# Patient Record
Sex: Male | Born: 1950 | Race: White | Hispanic: No | Marital: Married
Health system: Midwestern US, Community
[De-identification: ages and names within clinical notes are randomized; demographics above are authoritative.]

## PROBLEM LIST (undated history)

## (undated) DIAGNOSIS — N201 Calculus of ureter: Secondary | ICD-10-CM

## (undated) DIAGNOSIS — N132 Hydronephrosis with renal and ureteral calculous obstruction: Secondary | ICD-10-CM

## (undated) DIAGNOSIS — H269 Unspecified cataract: Secondary | ICD-10-CM

## (undated) DIAGNOSIS — I639 Cerebral infarction, unspecified: Secondary | ICD-10-CM

## (undated) DIAGNOSIS — R7309 Other abnormal glucose: Secondary | ICD-10-CM

## (undated) DIAGNOSIS — M109 Gout, unspecified: Secondary | ICD-10-CM

## (undated) DIAGNOSIS — E785 Hyperlipidemia, unspecified: Secondary | ICD-10-CM

## (undated) DIAGNOSIS — I7771 Dissection of carotid artery: Secondary | ICD-10-CM

## (undated) DIAGNOSIS — R350 Frequency of micturition: Secondary | ICD-10-CM

## (undated) DIAGNOSIS — N401 Enlarged prostate with lower urinary tract symptoms: Secondary | ICD-10-CM

## (undated) HISTORY — PX: CATARACT EXTRACTION: SUR2

## (undated) HISTORY — PX: VASECTOMY: SHX75

## (undated) HISTORY — DX: Hyperlipidemia, unspecified: E78.5

## (undated) HISTORY — DX: Cerebral infarction, unspecified: I63.9

## (undated) HISTORY — DX: Other abnormal glucose: R73.09

## (undated) HISTORY — DX: Gout, unspecified: M10.9

## (undated) HISTORY — PX: TONSILLECTOMY AND ADENOIDECTOMY: SUR1326

## (undated) HISTORY — DX: Dissection of carotid artery: I77.71

## (undated) HISTORY — DX: Unspecified cataract: H26.9

---

## 2004-10-07 HISTORY — PX: COLONOSCOPY: SHX174

## 2004-12-28 ENCOUNTER — Ambulatory Visit: Payer: Self-pay | Admitting: Internal Medicine

## 2005-01-15 ENCOUNTER — Ambulatory Visit: Payer: Self-pay | Admitting: Internal Medicine

## 2005-04-12 ENCOUNTER — Ambulatory Visit: Payer: Self-pay | Admitting: Internal Medicine

## 2006-04-14 ENCOUNTER — Ambulatory Visit: Payer: Self-pay | Admitting: Internal Medicine

## 2006-11-07 ENCOUNTER — Ambulatory Visit: Payer: Self-pay | Admitting: Internal Medicine

## 2006-12-31 ENCOUNTER — Ambulatory Visit: Payer: Self-pay | Admitting: Internal Medicine

## 2007-04-20 ENCOUNTER — Ambulatory Visit: Payer: Self-pay | Admitting: Internal Medicine

## 2007-04-24 ENCOUNTER — Encounter (INDEPENDENT_AMBULATORY_CARE_PROVIDER_SITE_OTHER): Payer: Self-pay | Admitting: *Deleted

## 2007-04-24 LAB — CONVERTED CEMR LAB
CO2: 27 meq/L (ref 19–32)
Calcium: 9.6 mg/dL (ref 8.4–10.5)
Chloride: 106 meq/L (ref 96–112)
Cholesterol: 176 mg/dL (ref 0–200)
GFR calc Af Amer: 100 mL/min
GFR calc non Af Amer: 82 mL/min
LDL Cholesterol: 123 mg/dL — ABNORMAL HIGH (ref 0–99)
Potassium: 4.6 meq/L (ref 3.5–5.1)
Total CHOL/HDL Ratio: 5.5
VLDL: 21 mg/dL (ref 0–40)

## 2007-05-28 ENCOUNTER — Ambulatory Visit: Payer: Self-pay | Admitting: Internal Medicine

## 2007-05-28 DIAGNOSIS — E785 Hyperlipidemia, unspecified: Secondary | ICD-10-CM | POA: Insufficient documentation

## 2007-05-28 DIAGNOSIS — Z8739 Personal history of other diseases of the musculoskeletal system and connective tissue: Secondary | ICD-10-CM | POA: Insufficient documentation

## 2008-03-25 ENCOUNTER — Ambulatory Visit: Payer: Self-pay | Admitting: Internal Medicine

## 2008-05-24 ENCOUNTER — Telehealth (INDEPENDENT_AMBULATORY_CARE_PROVIDER_SITE_OTHER): Payer: Self-pay | Admitting: *Deleted

## 2008-10-20 ENCOUNTER — Ambulatory Visit: Payer: Self-pay | Admitting: Internal Medicine

## 2008-10-25 ENCOUNTER — Encounter (INDEPENDENT_AMBULATORY_CARE_PROVIDER_SITE_OTHER): Payer: Self-pay | Admitting: *Deleted

## 2008-10-25 ENCOUNTER — Telehealth (INDEPENDENT_AMBULATORY_CARE_PROVIDER_SITE_OTHER): Payer: Self-pay | Admitting: *Deleted

## 2009-01-16 ENCOUNTER — Ambulatory Visit: Payer: Self-pay | Admitting: Internal Medicine

## 2009-01-23 LAB — CONVERTED CEMR LAB
Basophils Absolute: 0.1 10*3/uL (ref 0.0–0.1)
Bilirubin, Direct: 0 mg/dL (ref 0.0–0.3)
Cholesterol: 150 mg/dL (ref 0–200)
Eosinophils Relative: 2.5 % (ref 0.0–5.0)
HDL: 27.3 mg/dL — ABNORMAL LOW (ref >39.00)
Hgb A1c MFr Bld: 6.4 % (ref 4.6–6.5)
Monocytes Absolute: 0.4 10*3/uL (ref 0.1–1.0)
Monocytes Relative: 7.7 % (ref 3.0–12.0)
Neutro Abs: 2.6 10*3/uL (ref 1.4–7.7)
Neutrophils Relative %: 50.9 % (ref 43.0–77.0)
Platelets: 116 10*3/uL — ABNORMAL LOW (ref 150.0–400.0)
Total Bilirubin: 1.2 mg/dL (ref 0.3–1.2)
Total CHOL/HDL Ratio: 5
Triglycerides: 102 mg/dL (ref 0.0–149.0)
VLDL: 20.4 mg/dL (ref 0.0–40.0)

## 2009-01-24 ENCOUNTER — Ambulatory Visit: Payer: Self-pay | Admitting: Internal Medicine

## 2009-01-24 DIAGNOSIS — D696 Thrombocytopenia, unspecified: Secondary | ICD-10-CM | POA: Insufficient documentation

## 2009-01-24 DIAGNOSIS — R7301 Impaired fasting glucose: Secondary | ICD-10-CM

## 2009-01-24 LAB — CONVERTED CEMR LAB: LDL Goal: 100 mg/dL

## 2009-05-01 ENCOUNTER — Ambulatory Visit: Payer: Self-pay | Admitting: Internal Medicine

## 2009-05-09 ENCOUNTER — Encounter (INDEPENDENT_AMBULATORY_CARE_PROVIDER_SITE_OTHER): Payer: Self-pay | Admitting: *Deleted

## 2009-05-16 ENCOUNTER — Ambulatory Visit: Payer: Self-pay | Admitting: Internal Medicine

## 2009-10-07 DIAGNOSIS — I7771 Dissection of carotid artery: Secondary | ICD-10-CM

## 2009-10-07 HISTORY — DX: Dissection of carotid artery: I77.71

## 2009-10-28 ENCOUNTER — Encounter: Payer: Self-pay | Admitting: Internal Medicine

## 2010-01-23 ENCOUNTER — Encounter: Payer: Self-pay | Admitting: Internal Medicine

## 2010-01-23 ENCOUNTER — Encounter (INDEPENDENT_AMBULATORY_CARE_PROVIDER_SITE_OTHER): Payer: Self-pay | Admitting: Internal Medicine

## 2010-01-23 ENCOUNTER — Ambulatory Visit: Payer: Self-pay | Admitting: Vascular Surgery

## 2010-01-23 ENCOUNTER — Telehealth: Payer: Self-pay | Admitting: Internal Medicine

## 2010-01-23 DIAGNOSIS — I639 Cerebral infarction, unspecified: Secondary | ICD-10-CM

## 2010-01-23 HISTORY — DX: Cerebral infarction, unspecified: I63.9

## 2010-01-24 ENCOUNTER — Telehealth: Payer: Self-pay | Admitting: Internal Medicine

## 2010-01-29 ENCOUNTER — Ambulatory Visit: Payer: Self-pay | Admitting: Internal Medicine

## 2010-01-29 ENCOUNTER — Ambulatory Visit: Payer: Self-pay | Admitting: Cardiovascular Disease

## 2010-01-29 DIAGNOSIS — I7771 Dissection of carotid artery: Secondary | ICD-10-CM

## 2010-01-29 LAB — CONVERTED CEMR LAB: POC INR: 2.3

## 2010-02-02 ENCOUNTER — Ambulatory Visit: Payer: Self-pay | Admitting: Cardiology

## 2010-02-05 ENCOUNTER — Telehealth (INDEPENDENT_AMBULATORY_CARE_PROVIDER_SITE_OTHER): Payer: Self-pay | Admitting: *Deleted

## 2010-02-12 ENCOUNTER — Ambulatory Visit: Payer: Self-pay | Admitting: Cardiology

## 2010-02-12 LAB — CONVERTED CEMR LAB: POC INR: 5

## 2010-02-19 ENCOUNTER — Ambulatory Visit: Payer: Self-pay | Admitting: Cardiovascular Disease

## 2010-03-02 ENCOUNTER — Ambulatory Visit: Payer: Self-pay | Admitting: Cardiovascular Disease

## 2010-03-26 ENCOUNTER — Ambulatory Visit: Payer: Self-pay | Admitting: Cardiology

## 2010-04-16 ENCOUNTER — Ambulatory Visit: Payer: Self-pay | Admitting: Internal Medicine

## 2010-04-16 LAB — CONVERTED CEMR LAB
ALT: 33 units/L (ref 0–53)
Alkaline Phosphatase: 65 units/L (ref 39–117)
Basophils Absolute: 0 10*3/uL (ref 0.0–0.1)
Basophils Relative: 0.7 % (ref 0.0–3.0)
Eosinophils Absolute: 0.1 10*3/uL (ref 0.0–0.7)
HCT: 41.6 % (ref 39.0–52.0)
Hemoglobin: 14.4 g/dL (ref 13.0–17.0)
Lymphs Abs: 1.5 10*3/uL (ref 0.7–4.0)
MCHC: 34.6 g/dL (ref 30.0–36.0)
MCV: 86.2 fL (ref 78.0–100.0)
Monocytes Absolute: 0.3 10*3/uL (ref 0.1–1.0)
Platelets: 88 10*3/uL — ABNORMAL LOW (ref 150.0–400.0)
RBC: 4.83 M/uL (ref 4.22–5.81)
RDW: 14.1 % (ref 11.5–14.6)
Total CHOL/HDL Ratio: 4
Total CK: 96 units/L (ref 7–232)
VLDL: 24.2 mg/dL (ref 0.0–40.0)

## 2010-04-23 ENCOUNTER — Ambulatory Visit: Payer: Self-pay | Admitting: Internal Medicine

## 2010-04-24 ENCOUNTER — Ambulatory Visit: Payer: Self-pay | Admitting: Internal Medicine

## 2010-04-24 LAB — CONVERTED CEMR LAB: POC INR: 2.3

## 2010-04-30 ENCOUNTER — Encounter: Payer: Self-pay | Admitting: Internal Medicine

## 2010-05-15 ENCOUNTER — Encounter: Admission: RE | Admit: 2010-05-15 | Discharge: 2010-05-15 | Payer: Self-pay | Admitting: Neurology

## 2010-06-28 ENCOUNTER — Encounter: Payer: Self-pay | Admitting: Cardiology

## 2010-09-13 ENCOUNTER — Inpatient Hospital Stay (HOSPITAL_COMMUNITY): Admission: EM | Admit: 2010-09-13 | Discharge: 2010-01-25 | Payer: Self-pay | Admitting: Emergency Medicine

## 2010-10-05 IMAGING — CT CT ANGIO HEAD
1 of 12 series · 1 of 33 positions shown · IV contrast ([ID] OMNI 350)
Comparison: 01/23/2010 and earlier.

CTA NECK

CLINICAL DATA: 58-year-old male with history of left ICA
occlusion/dissection.

CT ANGIOGRAPHY HEAD AND NECK
TECHNIQUE: Multidetector CT imaging of the head and neck was
performed using the standard protocol during bolus administration
of intravenous contrast.  Multiplanar CT image reconstructions
including MIPs were obtained to evaluate the vascular anatomy.
Carotid stenosis measurements (when applicable) are obtained
utilizing NASCET criteria, using the distal internal carotid
diameter as the denominator.
Contrast:  100 ml Omnipaque 350.

[Series 3: neck w/o · axial · non-contrast · 0.39mm/px · 1 of 89 slices shown]
[im 1/89  soft-tissue]
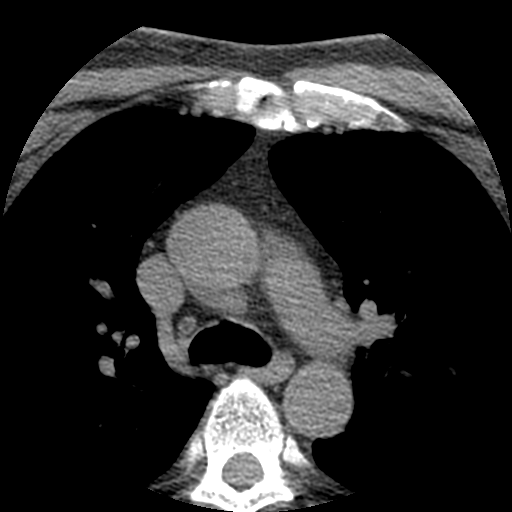

[1 of 33 positions shown; findings below may reference images not displayed]

FINDINGS: Visualized lung apices are clear. No acute osseous
abnormality identified.  Aside from mild ethmoid mucosal
thickening, the visualized paranasal sinuses and mastoids are
clear.  No lymphadenopathy.  Pharyngeal mucosal spaces and the
thyroid are within normal limits.  Parapharyngeal, retropharyngeal
and sublingual spaces are within normal limits.  Submandibular and
parotid glands are within normal limits. Visualized orbit soft
tissues are within normal limits.

Vascular Findings: Three vessel arch configuration; however the
left vertebral artery origin is low, nearly off the arch (series
601 image 27).   No arch atherosclerosis or great vessel stenosis.
Bilateral vertebral arteries are within normal limits to the skull
base, the right is mildly dominant.

Normal right common carotid artery, right carotid bifurcation and
cervical right ICA aside from a somewhat retropharyngeal course of
the latter.

Left common carotid artery is mildly diminutive compared to the
right.  The left carotid bifurcation is patent, there is abrupt
occlusion of the left ICA 18 mm beyond its origin (series 502 image
118). This is very similar in appearance to the prior exam, the
distal aspect of the patent lumen is slightly less irregular than
before.  There is no subsequent cervical left ICA enhancement.  See
intracranial findings below.

 Review of the MIP images confirms the above findings.
IMPRESSION: 1.  Stable occlusion of the left ICA 18 mm beyond its origin. See
intracranial findings below.
2.  Otherwise stable and negative neck CTA.  No acute findings in
the neck.

CTA HEAD
FINDINGS: Cerebral volume is within normal limits for age.  No
midline shift, ventriculomegaly, mass effect, evidence of mass
lesion, intracranial hemorrhage or evidence of cortically based
acute infarction.  Gray-white matter differentiation is within
normal limits throughout the brain.  Visualized scalp soft tissues
are within normal limits.  No acute osseous abnormality identified.

Vascular Findings:  Major intracranial venous structures are
normally enhancing.

 Distal vertebral arteries and vertebrobasilar junction are normal
aside from tortuosity.  Normal bilateral PICA vessels.  Basilar
artery, superior cerebellar arteries and PCA origins are normal.
Bilateral PCA branches are within normal limits.  Diminutive right
posterior communicating artery, the left is not identified.

The right ICA siphon is within normal limits aside from minimal
calcified atherosclerosis.  The right ICA terminus, right MCA, and
right ACA origins are normal.  Right MCA branches are within normal
limits.

The left anterior communicating artery is diminutive.  Bilateral
ACA branches are within normal limits.

There is minimal reconstitution of flow in the left ICA siphon,
beginning only faintly in the supraclinoid segment.  However, there
is better reconstitution of flow at the level of the left ICA
terminus.  The left MCA and ACA origins subsequently only are
mildly asymmetric compared to the right.  Left MCA branches are
within normal limits.  Left MCA enhancement compared to the right
is symmetric.

 Review of the MIP images confirms the above findings.
IMPRESSION: 1.  Little reconstitution of flow in the left ICA siphon, but
relatively good reconstitution of flow by the left ICA terminus.
Left MCA and ACA enhancement appears symmetric to the right.
2.  Otherwise negative intracranial CTA.
3. No acute intracranial abnormality.

## 2010-10-07 HISTORY — PX: EYE SURGERY: SHX253

## 2010-11-04 LAB — CONVERTED CEMR LAB
Albumin: 4.6 g/dL (ref 3.5–5.2)
Alkaline Phosphatase: 78 units/L (ref 39–117)
Basophils Absolute: 0 10*3/uL (ref 0.0–0.1)
Blood in Urine, dipstick: NEGATIVE
CO2: 27 meq/L (ref 19–32)
Calcium: 9.7 mg/dL (ref 8.4–10.5)
Cholesterol: 214 mg/dL (ref 0–200)
Eosinophils Absolute: 0.2 10*3/uL (ref 0.0–0.7)
Glucose, Bld: 107 mg/dL — ABNORMAL HIGH (ref 70–99)
HCT: 46.1 % (ref 39.0–52.0)
HDL goal, serum: 40 mg/dL
HDL: 29.7 mg/dL — ABNORMAL LOW (ref >39.0)
Hemoglobin: 15.8 g/dL (ref 13.0–17.0)
Hgb A1c MFr Bld: 6 % (ref 4.6–6.0)
Ketones, urine, test strip: NEGATIVE
Lymphocytes Relative: 37.4 % (ref 12.0–46.0)
MCV: 84.9 fL (ref 78.0–100.0)
Monocytes Absolute: 0.3 10*3/uL (ref 0.1–1.0)
Monocytes Relative: 6.5 % (ref 3.0–12.0)
Platelets: 108 10*3/uL — ABNORMAL LOW (ref 150–400)
Potassium: 3.6 meq/L (ref 3.5–5.1)
RDW: 12.8 % (ref 11.5–14.6)
TSH: 1.18 microintl units/mL (ref 0.35–5.50)
Total Bilirubin: 1.1 mg/dL (ref 0.3–1.2)
Triglycerides: 185 mg/dL — ABNORMAL HIGH (ref 0–149)
VLDL: 37 mg/dL (ref 0–40)
WBC Urine, dipstick: NEGATIVE

## 2010-11-06 NOTE — Medication Information (Signed)
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 no    Recent/future dental: no  Any missed doses?: no       Is patient compliant with meds? yes       Allergies: No Known Drug Allergies  Anticoagulation Management History:      The patient is taking warfarin and comes in today for a routine follow up visit.  Negative risk factors for bleeding include an age less than 68 years old and no history of CVA/TIA.  The bleeding index is 'low risk'.  Negative CHADS2 values include History of HTN, Age > 78 years old, History of Diabetes, and Prior Stroke/CVA/TIA.  Anticoagulation responsible provider: Juanda Chance MD, Smitty Cords.  INR POC: 2.6.  Cuvette Lot#: [id number].  Exp: 03/2011.    Anticoagulation Management Assessment/Plan:      The patient's current anticoagulation dose is Coumadin 7.5 mg tabs: 1 tab once daily.  The target INR is 2.0-3.0.  The next INR is due 02/12/2010.  Anticoagulation instructions were given to patient.  Results were reviewed/authorized by Weston Brass, PharmD.  He was notified by Weston Brass PharmD.         Prior Anticoagulation Instructions: INR 2.3  Start taking 7.5mg  daily except 3.75mg  on Wednesdays.  Recheck on Friday.    Current Anticoagulation Instructions: INR 2.6  Continue same dose of 1 tablet every day except 1.2 tablet on Wednesday

## 2010-11-06 NOTE — Progress Notes (Signed)
Summary: Request to speak with Dr  Phone Note Call from Bryan Abbott Call back at [phone]   Caller: Bryan Abbott Summary of Call: Spoke with Bryan Abbott,  Bryan Abbott:  I  would like to make sure Dr.Severus Brodzinski is aware that I'm in the Hospital. Bryan Abbott said he would like to speak with Dr.Artrell Lawless and let him know whats going on.   Me:  Informed Bryan Abbott that we have access to [facility] records through the computer.   Bryan Abbott:  He knows Dr.Kandance Yano is busy and would appreciate a call when he is avaliable.  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

## 2010-11-06 NOTE — Medication Information (Signed)
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 no    Recent/future dental: no  Any missed doses?: yes     Details: Missed 1 tablet 1 week ago  Is patient compliant with meds? yes       Current Medications (verified): 1)  Allopurinol 300 Mg  Tabs (Allopurinol) .... Take One Tablet Daily 2)  Pravastatin Sodium 40 Mg Tabs (Pravastatin Sodium) .Marland Kitchen.. 1 Qhs 3)  Tricor 145 Mg Tabs (Fenofibrate) .Marland Kitchen.. 1 Tab Once Daily 4)  Coumadin 7.5 Mg Tabs (Warfarin Sodium) .Marland Kitchen.. 1 Tab Once Daily 5)  Tylenol .... As Needed 6)  Depakote Er 500 Mg Xr24h-Tab (Divalproex Sodium) .... Take 1 Tablet Daily.  Allergies (verified): No Known Drug Allergies  Anticoagulation Management History:      The patient is taking warfarin and comes in today for a routine follow up visit.  Negative risk factors for bleeding include an age less than 41 years old and no history of CVA/TIA.  The bleeding index is 'low risk'.  Negative CHADS2 values include History of HTN, Age > 10 years old, History of Diabetes, and Prior Stroke/CVA/TIA.  His last INR was 3.7.  Anticoagulation responsible provider: Shirlee Latch MD, Zenon Leaf.  INR POC: 2.6.  Cuvette Lot#: [id number].  Exp: 06/2011.    Anticoagulation Management Assessment/Plan:      The patient's current anticoagulation dose is Coumadin 7.5 mg tabs: 1 tab once daily.  The target INR is 2.0-3.0.  The next INR is due 04/23/2010.  Anticoagulation instructions were given to patient.  Results were reviewed/authorized by Jeralene Peters, PharmD.         Prior Anticoagulation Instructions: INR  3.0  Start NEW dosing schedule of 1/2 tablet on Wednesday and Friday and 1 tablet all other days.  Return to clinic in 3 weeks.    Current Anticoagulation Instructions: INR 2.6  Continue taking 1 tablet everyday except take 1/2 tablet on Wednesdays and Fridays. Recheck in 4 weeks.

## 2010-11-06 NOTE — Letter (Signed)
Summary: [facility]  [facility]   Imported By: Lennie Odor 05/07/2010 10:40:00  _____________________________________________________________________  External Attachment:    Type:   Image     Comment:   External Document

## 2010-11-06 NOTE — Progress Notes (Signed)
Summary: refill   Phone Note Refill Request Message from:  Patient on Feb 05, 2010 8:44 AM  Refills Requested: Medication #1:  ALLOPURINOL 300 MG  TABS TAKE ONE TABLET DAILY  Medication #2:  PRAVASTATIN SODIUM 40 MG TABS 1 qhs patient has cpx scheduled [id number] - lab [phone] he needs refill until then - [facility] [place]   Initial call taken by: Okey Regal [place],  Feb 05, 2010 8:46 AM    Prescriptions: PRAVASTATIN SODIUM 40 MG TABS (PRAVASTATIN SODIUM) 1 qhs  #90 x 0   Entered by:   Shonna Chock   Authorized by:   Marga Melnick MD   Signed by:   Shonna Chock on 02/05/2010   Method used:   Electronically to        [facility]       [address]       [place], [place]  [address]       Ph: [phone]       Fax: [phone]   RxID:   [id number] ALLOPURINOL 300 MG  TABS (ALLOPURINOL) TAKE ONE TABLET DAILY  #90 x 0   Entered by:   Shonna Chock   Authorized by:   Marga Melnick MD   Signed by:   Shonna Chock on 02/05/2010   Method used:   Electronically to        [facility]       [address]       [place], [place]  [address]       Ph: [phone]       Fax: [phone]   RxID:   [id number]

## 2010-11-06 NOTE — Medication Information (Signed)
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 yes       Details: finished augmentin course and started new depakote rx  Recent/future dental: no  Any missed doses?: no       Is patient compliant with meds? yes       Current Medications (verified): 1)  Allopurinol 300 Mg  Tabs (Allopurinol) .... Take One Tablet Daily 2)  Pravastatin Sodium 40 Mg Tabs (Pravastatin Sodium) .Marland Kitchen.. 1 Qhs 3)  Tricor 145 Mg Tabs (Fenofibrate) .Marland Kitchen.. 1 Tab Once Daily 4)  Coumadin 7.5 Mg Tabs (Warfarin Sodium) .Marland Kitchen.. 1 Tab Once Daily 5)  Tylenol .... As Needed 6)  Depakote Er 500 Mg Xr24h-Tab (Divalproex Sodium) .... Take 1 Tablet Daily.  Allergies (verified): No Known Drug Allergies  Anticoagulation Management History:      The patient is taking warfarin and comes in today for a routine follow up visit.  Negative risk factors for bleeding include an age less than 83 years old and no history of CVA/TIA.  The bleeding index is 'low risk'.  Negative CHADS2 values include History of HTN, Age > 2 years old, History of Diabetes, and Prior Stroke/CVA/TIA.  His last INR was 3.7.  Anticoagulation responsible provider: Eden Emms MD, Theron Arista.  INR POC: 3.0.  Cuvette Lot#: [id number].  Exp: 05/2011.    Anticoagulation Management Assessment/Plan:      The patient's current anticoagulation dose is Coumadin 7.5 mg tabs: 1 tab once daily.  The target INR is 2.0-3.0.  The next INR is due 03/23/2010.  Anticoagulation instructions were given to patient.  Results were reviewed/authorized by Eda Keys, PharmD.  He was notified by  Eda Keys.         Prior Anticoagulation Instructions: INR = 3.6  Hold tonights dose  The patient is to continue with the same dose of coumadin.  This dosage includes: Then resume normal schedule on tuesday (1 tablet every day except wednesdays take 1/2 tab.   Current Anticoagulation Instructions: INR 3.0  Start NEW dosing schedule of 1/2 tablet on Wednesday and Friday and 1 tablet all other days.  Return to clinic in 3 weeks.

## 2010-11-06 NOTE — Assessment & Plan Note (Signed)
Summary: hospital follow-up//lch   Vital Signs:  Patient profile:   60 year old male Weight:      207 pounds Temp:     98.7 degrees F oral Pulse rate:   72 / minute Resp:     14 per minute BP sitting:   100 / 70  (left arm)  Vitals Entered By: Jeremy Johann CMA (January 29, 2010 12:48 PM) CC: hosp f/u Comments REVIEWED MED LIST, PATIENT AGREED DOSE AND INSTRUCTION CORRECT    CC:  hosp f/u.  History of Present Illness: Presentation was visual field loss & blurred vision.ER 01/22/2010  records reviewed :"dissection of L carotid artery" on MRA; platelet # 94,000. BP ranged 124/76-147/89 in ER;156/? @ home prior to ER visit.Other labs WNL .Only neck trauma was riding a  mechanical bull 1 month ago.No PMH of HTN.   Allergies (verified): No Known Drug Allergies  Review of Systems Eyes:  Denies blurring, double vision, and vision loss-1 eye; Vision back to normal. Neuro:  Denies brief paralysis, disturbances in coordination, numbness, poor balance, tingling, tremors, and weakness.  Physical Exam  General:  well-nourished,in no acute distress; alert,appropriate and cooperative throughout examination Eyes:  No corneal or conjunctival inflammation noted. EOMI. Perrla. Field of  Vision grossly normal. Heart:  normal rate, regular rhythm, no gallop, no rub, no JVD, and grade 1/2 /6 systolic murmur R base.   Pulses:  R and L carotid,radial,dorsalis pedis and posterior tibial pulses are full and equal bilaterally. No carotid bruit Neurologic:  alert & oriented X3, strength normal in all extremities, and DTRs symmetrical and normal.     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 1 qhs    Tricor 145 Mg Tabs (Fenofibrate) .Marland Kitchen... 1 tab once daily  Problem # 3:  THROMBOCYTOPENIA  (ICD-287.5)  Problem # 4:  FASTING HYPERGLYCEMIA (ICD-790.29)  Complete Medication List: 1)  Allopurinol 300 Mg Tabs (Allopurinol) .... Take one tablet daily 2)  Pravastatin Sodium 40 Mg Tabs (Pravastatin sodium) .Marland Kitchen.. 1 qhs 3)  Tricor 145 Mg Tabs (Fenofibrate) .Marland Kitchen.. 1 tab once daily 4)  Coumadin 7.5 Mg Tabs (Warfarin sodium) .Marland Kitchen.. 1 tab once daily 5)  Tylenol  .... As needed  Patient Instructions: 1)  Fasting labs in 10 weeks:CPK 995.20, 2)  Hepatic Panel  995.20, 3)  Lipid Panel 272.4, 4)  HbgA1C  790.29. 5)  CBC w/ Diff 287.5, 995.20. 6)  PSA .

## 2010-11-06 NOTE — Miscellaneous (Signed)
Summary: [facility]  [facility]   Imported By: Lanelle Bal 11/03/2009 10:06:32  _____________________________________________________________________  External Attachment:    Type:   Image     Comment:   External Document

## 2010-11-06 NOTE — Medication Information (Signed)
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  PCP aware  Bleeding/hemorrhagic complications: no    Recent/future hospitalizations: no    Any changes in medication regimen? no    Recent/future dental: no  Any missed doses?: no       Is patient compliant with meds? yes       Allergies: No Known Drug Allergies  Anticoagulation Management History:      The patient is taking warfarin and comes in today for a routine follow up visit.  Negative risk factors for bleeding include an age less than 53 years old and no history of CVA/TIA.  The bleeding index is 'low risk'.  Negative CHADS2 values include History of HTN, Age > 58 years old, History of Diabetes, and Prior Stroke/CVA/TIA.  His last INR was 3.7.  Anticoagulation responsible provider: Graciela Husbands MD, Viviann Spare.  INR POC: 2.3.  Cuvette Lot#: [id number].  Exp: 07/2011.    Anticoagulation Management Assessment/Plan:      The patient's current anticoagulation dose is Coumadin 7.5 mg tabs: 1 tab once daily.  The target INR is 2.0-3.0.  The next INR is due 05/21/2010.  Anticoagulation instructions were given to patient.  Results were reviewed/authorized by Weston Brass, PharmD.  He was notified by Weston Brass PharmD.         Prior Anticoagulation Instructions: INR 2.6  Continue taking 1 tablet everyday except take 1/2 tablet on Wednesdays and Fridays. Recheck in 4 weeks.  Current Anticoagulation Instructions: INR 2.3  Continue same dose of 1 tablet every day except 1/2 tablet on Wednesday and Friday.  Recheck INR in 4 weeks.

## 2010-11-06 NOTE — Medication Information (Signed)
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 yes       Details: Is taking an augmentin for Ear infection  Recent/future dental: no  Any missed doses?: no       Is patient compliant with meds? yes      Comments: Pt. not sure why INR was high last visit or this visit.  He is currently taking augmentin which should not affect his INR too much.   He will skip tonights dose, resume normal schedule tuesday, and increase his intake of greens and come back in two weeks.    Current Medications (verified): 1)  Allopurinol 300 Mg  Tabs (Allopurinol) .... Take One Tablet Daily 2)  Pravastatin Sodium 40 Mg Tabs (Pravastatin Sodium) .Marland Kitchen.. 1 Qhs 3)  Tricor 145 Mg Tabs (Fenofibrate) .Marland Kitchen.. 1 Tab Once Daily 4)  Coumadin 7.5 Mg Tabs (Warfarin Sodium) .Marland Kitchen.. 1 Tab Once Daily 5)  Tylenol .... As Needed  Allergies (verified): No Known Drug Allergies  Anticoagulation Management History:      Negative risk factors for bleeding include an age less than 49 years old and no history of CVA/TIA.  The bleeding index is 'low risk'.  Negative CHADS2 values include History of HTN, Age > 9 years old, History of Diabetes, and Prior Stroke/CVA/TIA.  Today's INR is 3.7.  Anticoagulation responsible provider: Clifton James MD, Cristal Deer.  INR POC: 3.7.  Cuvette Lot#: [id number].  Exp: 05/2011.    Anticoagulation Management Assessment/Plan:      The patient's current anticoagulation dose is Coumadin 7.5 mg tabs: 1 tab once daily.  The target INR is 2.0-3.0.  The next INR is due 03/02/2010.   Anticoagulation instructions were given to patient.  Results were reviewed/authorized by Loma Newton, PharmD.  He was notified by Loma Newton.         Prior Anticoagulation Instructions: INR 5.0  No coumadin today or tomorrow.  Continue taking 1 tablet daily except take 0.5 tablet on Wednesdays. Recheck in 1 week.  Remember to keep vitamin k intake consistent.  Current Anticoagulation Instructions: INR = 3.6  Hold tonights dose  The patient is to continue with the same dose of coumadin.  This dosage includes: Then resume normal schedule on tuesday (1 tablet every day except wednesdays take 1/2 tab.

## 2010-11-06 NOTE — Medication Information (Signed)
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  Allergies: No Known Drug Allergies  Anticoagulation Management History:      Negative risk factors for bleeding include an age less than 22 years old and no history of CVA/TIA.  The bleeding index is 'low risk'.  Negative CHADS2 values include History of HTN, Age > 46 years old, History of Diabetes, and Prior Stroke/CVA/TIA.  His last INR was 3.7.  Anticoagulation responsible provider: Shirlee Latch MD, [place].  Exp: 07/2011.    Anticoagulation Management Assessment/Plan:      The patient's current anticoagulation dose is Coumadin 7.5 mg tabs: 1 tab once daily.  The target INR is 2.0-3.0.  The next INR is due 05/21/2010.  Anticoagulation instructions were given to patient.  Results were reviewed/authorized by Inactive.         Prior Anticoagulation Instructions: INR 2.3  Continue same dose of 1 tablet every day except 1/2 tablet on Wednesday and Friday.  Recheck INR in 4 weeks.

## 2010-11-06 NOTE — Medication Information (Signed)
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 no    Recent/future dental: no  Any missed doses?: no       Is patient compliant with meds? yes      Comments: Did have a couple of drinks last week and glass of wine last night.  Current Medications (verified): 1)  Allopurinol 300 Mg  Tabs (Allopurinol) .... Take One Tablet Daily 2)  Pravastatin Sodium 40 Mg Tabs (Pravastatin Sodium) .Marland Kitchen.. 1 Qhs 3)  Tricor 145 Mg Tabs (Fenofibrate) .Marland Kitchen.. 1 Tab Once Daily 4)  Coumadin 7.5 Mg Tabs (Warfarin Sodium) .Marland Kitchen.. 1 Tab Once Daily 5)  Tylenol .... As Needed  Allergies: No Known Drug Allergies  Anticoagulation Management History:      The patient is taking warfarin and comes in today for a routine follow up visit.  Negative risk factors for bleeding include an age less than 60 years old and no history of CVA/TIA.  The bleeding index is 'low risk'.  Negative CHADS2 values include History of HTN, Age > 65 years old, History of Diabetes, and Prior Stroke/CVA/TIA.  Anticoagulation responsible provider: Riley Kill MD, Maisie Fus.  INR POC: 5.0.  Cuvette Lot#: [id number].  Exp: 03/2011.    Anticoagulation Management Assessment/Plan:      The patient's current anticoagulation dose is Coumadin 7.5 mg tabs: 1 tab once daily.  The target INR is 2.0-3.0.  The next INR is due 02/19/2010.  Anticoagulation instructions were given to patient.  Results were reviewed/authorized by Jeralene Peters, PharmD.         Prior Anticoagulation Instructions: INR 2.6  Continue same dose of 1  tablet every day except 1.2 tablet on Wednesday   Current Anticoagulation Instructions: INR 5.0  No coumadin today or tomorrow.  Continue taking 1 tablet daily except take 0.5 tablet on Wednesdays. Recheck in 1 week.  Remember to keep vitamin k intake consistent.

## 2010-11-06 NOTE — Assessment & Plan Note (Signed)
Summary: cpx//lch   Vital Signs:  Patient profile:   60 year old male Height:      71.75 inches Weight:      200 pounds BMI:     27.41 Temp:     98.6 degrees F oral Pulse rate:   60 / minute Resp:     14 per minute BP sitting:   112 / 70  (left arm) Cuff size:   large  Vitals Entered By: Shonna Chock CMA (April 23, 2010 11:04 AM)    Primary Care Provider:  Dr Marga Melnick   History of Present Illness: Bryan Abbott is here for a physical; he is asymptomatic.  Lipid Management History:      Positive NCEP/ATP III risk factors include male age 69 years old or older, HDL cholesterol less than 40, and family history for ischemic heart disease (females less than 8 years old).  Negative NCEP/ATP III risk factors include non-diabetic, non-tobacco-user status, non-hypertensive, no ASHD (atherosclerotic heart disease), no prior stroke/TIA, no peripheral vascular disease, and no history of aortic aneurysm.     Preventive Screening-Counseling & Management  Caffeine-Diet-Exercise     Does Patient Exercise: yes  Current Medications (verified): 1)  Allopurinol 300 Mg  Tabs (Allopurinol) .... Take One Tablet Daily 2)  Pravastatin Sodium 40 Mg Tabs (Pravastatin Sodium) .Marland Kitchen.. 1 Qhs 3)  Tricor 145 Mg Tabs (Fenofibrate) .Marland Kitchen.. 1 Tab Once Daily 4)  Coumadin 7.5 Mg Tabs (Warfarin Sodium) .Marland Kitchen.. 1 Tab Once Daily 5)  Tylenol .... As Needed  Allergies (verified): No Known Drug Allergies  Past History:  Past Medical History: Decreased platelets (thrombocytopenia)  Code:287.5 Gout Hyperlipidemia: NMR Lipoprofile : LDL  162(2135/1147) , HDL 37, TG 115. LDL goal = < 100. 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 aneurysm Mother:DM , dyslipidemia Siblings: 1/2 bro & 1/2 sister blood clots ? location; MGM: MI @ 55;M aunt:  ovarian cancer; M uncle : cancer ? primary  Social History: Occupation: Sales Married Never Smoked Alcohol use: socially Regular exercise-yes: CVE as walk /run 60 min 3X/week Does Patient Exercise:  yes  Review of Systems General:  Denies chills, fatigue, fever, sweats, and weight loss. Eyes:  Denies blurring, double vision, and vision loss-both eyes. ENT:  Denies difficulty swallowing and hoarseness. CV:  Denies chest pain or discomfort, difficulty breathing at night, difficulty breathing while lying down, leg cramps with exertion, palpitations, shortness of breath with exertion, swelling of feet, and swelling of hands. Resp:  Denies cough, shortness of breath, and sputum productive. GI:  Denies abdominal pain, bloody stools, dark tarry stools, and indigestion. GU:  Denies discharge, dysuria, and hematuria. MS:  Denies joint redness, joint swelling, low back pain, mid back pain, and thoracic pain; Last gout attack 04/2009 He twisted L ankle last night; pain with weight bearing.. Derm:  Denies changes in nail beds, dryness, and lesion(s). Neuro:  Denies brief paralysis, difficulty with concentration, disturbances in coordination, headaches, memory loss, numbness, poor balance, tingling, visual disturbances, and weakness. Psych:  Denies anxiety and depression. Endo:  Denies cold intolerance, excessive hunger, excessive thirst, excessive urination, and heat intolerance. Heme:  Denies abnormal bruising and bleeding. Allergy:  Denies itching eyes and sneezing.  Physical Exam  General:  well-nourished,in no acute distress; alert,appropriate and cooperative throughout examination Head:  Normocephalic and atraumatic without obvious abnormalities.  Pattern  alopecia  Eyes:  No corneal or conjunctival inflammation noted. EOMI. Perrla. Funduscopic exam benign, without hemorrhages, exudates or papilledema. Field of Vision grossly normal. Ears:  External ear exam shows no significant lesions or  deformities.  Otoscopic examination reveals clear canals, tympanic membranes are intact bilaterally without bulging, retraction, inflammation or discharge. Hearing is grossly normal bilaterally. Nose:  External nasal examination shows no deformity or inflammation. Nasal mucosa are pink and moist without lesions or exudates. Mouth:  Oral mucosa and oropharynx without lesions or exudates.  Teeth in good repair. Neck:  No deformities, masses, or tenderness noted. Lungs:  Normal respiratory effort, chest expands symmetrically. Lungs are clear to auscultation, no crackles or wheezes. Heart:  Normal rate and regular rhythm. S1 and S2 normal without gallop, murmur, click, rub or other extra sounds. Abdomen:  Bowel sounds positive,abdomen soft and non-tender without masses, organomegaly or hernias noted. Rectal:  No external abnormalities noted. Normal sphincter tone. No rectal masses or tenderness. Genitalia:  Testes bilaterally descended without nodularity, tenderness or masses. No scrotal masses or lesions. R testicle normal ,but  larger than L. No penis lesions or urethral discharge. Prostate:  Prostate gland firm and smooth, no enlargement, nodularity, tenderness, mass, asymmetry or induration. Msk:  No deformity or scoliosis noted of thoracic or lumbar spine.   Pulses:  R and L carotid,radial,dorsalis pedis and posterior tibial pulses are full and equal bilaterally Extremities:  No clubbing, cyanosis, edema, or deformity noted with normal full range of motion of all joints.  Intermittent pain L lateral foot with ambulation  Neurologic:  alert & oriented X3, strength normal in all extremities, gait normal ( except slight foot pain intermittently as noted), and DTRs symmetrical and normal.   Skin:  Intact without suspicious lesions or rashes. No bruising L foot Cervical Nodes:  No lymphadenopathy noted Axillary Nodes:  No palpable lymphadenopathy Inguinal Nodes:  No significant adenopathy Psych:   memory intact for recent and remote, normally interactive, and good eye contact.     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 1 qhs    Tricor 145 Mg Tabs (Fenofibrate) .Marland Kitchen... 1 tab once daily  Problem # 4:  GOUT NOS (ICD-274.9) Quiescent  His updated medication list for this problem includes:    Allopurinol 300 Mg Tabs (Allopurinol) .Marland Kitchen... Take one tablet daily  Problem # 5:  THROMBOCYTOPENIA (ICD-287.5) Platelet count 88,000  Problem # 6:  FASTING HYPERGLYCEMIA (ICD-790.29) A1c in non Diabetes range  Complete Medication List: 1)  Allopurinol 300 Mg Tabs (Allopurinol) .... Take one tablet daily 2)  Pravastatin Sodium 40 Mg Tabs (Pravastatin sodium) .Marland Kitchen.. 1 qhs 3)  Tricor 145 Mg Tabs (Fenofibrate) .Marland Kitchen.. 1 tab once daily 4)  Coumadin 7.5 Mg Tabs (Warfarin sodium) .Marland Kitchen.. 1 tab once daily 5)  Tylenol  .... As needed  Lipid Assessment/Plan:      Based on NCEP/ATP III, the patient's risk factor category is "2 or more risk factors and a calculated 10 year CAD risk of < 20%".  The patient's lipid goals are as follows: Total cholesterol goal is 200; LDL cholesterol goal is 100; HDL cholesterol goal is 40; Triglyceride goal is 150.  His LDL cholesterol goal has been met.  Secondary causes for  hyperlipidemia have been ruled out.  He has been counseled on adjunctive measures for lowering his cholesterol and has been provided with dietary instructions.    Patient Instructions: 1)  Check your Blood Pressure regularly. If it is above: 135/85 ON AVERAGE  you should make an appointment.Epsom salts soaks two times a day for foot pain; Xray if symptoms persist.Wear a good support shoe until pain  gone. Prescriptions: ALLOPURINOL 300 MG  TABS (ALLOPURINOL) TAKE ONE TABLET DAILY  #90 x 3   Entered and Authorized by:   Marga Melnick MD   Signed by:   Marga Melnick MD on 04/23/2010   Method used:   Faxed to ...       [facility] (retail)       [address]       [place], [place]  [address]       Ph: [phone]       Fax: [phone]   RxID:   [id number] PRAVASTATIN SODIUM 40 MG TABS (PRAVASTATIN SODIUM) 1 qhs  #90 x 3   Entered and Authorized by:   Marga Melnick MD   Signed by:   Marga Melnick MD on 04/23/2010   Method used:   Faxed to ...       [facility] (retail)       [address]       [place], [place]  [address]       Ph: [phone]       Fax: [phone]   RxID:   [id number]

## 2010-11-06 NOTE — Medication Information (Signed)
Summary: NEW CVA  Anticoagulant Therapy  Managed by: Cloyde Reams, RN, BSN PCP: Dr Marga Melnick Supervising MD: Excell Seltzer MD, Casimiro Needle Indication 1: cerebrovascular Accident (436) Lab Used: [facility] [place] Site: [address] INR POC 2.3 INR RANGE 2.0-3.0  Dietary changes: yes       Details: Educated pt on importance of consistancy of vit K in the diet and on limited ETOH intake while on coumadin.    Health status changes: no    Bleeding/hemorrhagic complications: yes       Details: Educated pt on moniotring for s+s of bleeding.  Recent/future hospitalizations: yes       Details: [facility] on [facility]10.  Any changes in medication regimen? yes       Details: Medication list accurate and up to date.  Pt educated on calling with any added or discontinued medications.  Pt aware of multiple medication interactions while on coumadin.     Recent/future dental: no  Any missed doses?: yes     Details: Pt educated on importance of taking coumadin everyday at the same time.    Is patient compliant with meds? yes      Comments: Taking 7.5mg  daily since discharge on 01/25/10.  Received 5mg  on 4/19 and 4/20.  INR at discharge on 01/25/10- 1.03. [place]   Allergies (verified): No Known Drug Allergies  Anticoagulation Management History:      The patient is taking warfarin and comes in today for a routine follow up visit.  Negative risk factors for bleeding include an age less than 49 years old and no history of CVA/TIA.  The bleeding index is 'low risk'.  Negative CHADS2 values include History of HTN, Age > 60 years old, History of Diabetes, and Prior Stroke/CVA/TIA.  Anticoagulation responsible provider: Excell Seltzer MD, Casimiro Needle.  INR POC: 2.3.  Cuvette Lot#: [id number].  Exp: 03/2011.    Anticoagulation Management Assessment/Plan:      The patient's current anticoagulation dose is Coumadin 7.5 mg tabs: 1 tab once daily.  The target INR is  2.0-3.0.  The next INR is due 02/02/2010.  Results were reviewed/authorized by Cloyde Reams, RN, BSN.  He was notified by Cloyde Reams RN.         Current Anticoagulation Instructions: INR 2.3  Start taking 7.5mg  daily except 3.75mg  on Wednesdays.  Recheck on Friday.

## 2010-11-06 NOTE — Progress Notes (Signed)
Summary: Bryan Abbott pt in hospital  Phone Note Call from Patient   Caller: Patient Summary of Call: Pt called to inform dr hopper that he has been admitted into the hospital for a possible stroke....................Marland KitchenFelecia Deloach CMA  January 23, 2010 8:35 AM   Follow-up for Phone Call        noted Follow-up by: Marga Melnick MD,  January 23, 2010 4:39 PM

## 2010-12-25 LAB — HEPARIN LEVEL (UNFRACTIONATED)
Heparin Unfractionated: 0.35 IU/mL (ref 0.30–0.70)
Heparin Unfractionated: 0.45 IU/mL (ref 0.30–0.70)

## 2010-12-25 LAB — DIFFERENTIAL
Basophils Absolute: 0 K/uL (ref 0.0–0.1)
Basophils Relative: 0 % (ref 0–1)
Eosinophils Absolute: 0.1 K/uL (ref 0.0–0.7)
Eosinophils Relative: 1 % (ref 0–5)
Lymphocytes Relative: 23 % (ref 12–46)
Lymphs Abs: 1.8 K/uL (ref 0.7–4.0)
Monocytes Absolute: 0.4 K/uL (ref 0.1–1.0)
Monocytes Relative: 5 % (ref 3–12)
Neutro Abs: 5.5 K/uL (ref 1.7–7.7)
Neutrophils Relative %: 71 % (ref 43–77)

## 2010-12-25 LAB — MAGNESIUM
Magnesium: 2.1 mg/dL (ref 1.5–2.5)
Magnesium: 2.1 mg/dL (ref 1.5–2.5)

## 2010-12-25 LAB — BASIC METABOLIC PANEL
BUN: 8 mg/dL (ref 6–23)
CO2: 22 mEq/L (ref 19–32)
Calcium: 9.1 mg/dL (ref 8.4–10.5)
Creatinine, Ser: 0.93 mg/dL (ref 0.4–1.5)
GFR calc non Af Amer: >60 mL/min (ref >60)
Glucose, Bld: 117 mg/dL — ABNORMAL HIGH (ref 70–99)
Potassium: 4 mEq/L (ref 3.5–5.1)
Sodium: 140 mEq/L (ref 135–145)

## 2010-12-25 LAB — CARDIAC PANEL(CRET KIN+CKTOT+MB+TROPI)
Total CK: 146 U/L (ref 7–232)
Total CK: 159 U/L (ref 7–232)
Troponin I: 0.01 ng/mL (ref 0.00–0.06)

## 2010-12-25 LAB — URINALYSIS, ROUTINE W REFLEX MICROSCOPIC
Bilirubin Urine: NEGATIVE
Hgb urine dipstick: NEGATIVE
Specific Gravity, Urine: 1.009 (ref 1.005–1.030)
Urobilinogen, UA: 0.2 mg/dL (ref 0.0–1.0)
pH: 5 (ref 5.0–8.0)

## 2010-12-25 LAB — PROTIME-INR
INR: 1.02 (ref 0.00–1.49)
INR: 1.08 (ref 0.00–1.49)
Prothrombin Time: 13.3 s (ref 11.6–15.2)
Prothrombin Time: 13.7 seconds (ref 11.6–15.2)
Prothrombin Time: 13.9 seconds (ref 11.6–15.2)

## 2010-12-25 LAB — POCT I-STAT, CHEM 8
Calcium, Ion: 1.01 mmol/L — ABNORMAL LOW (ref 1.12–1.32)
Creatinine, Ser: 0.9 mg/dL (ref 0.4–1.5)
Glucose, Bld: 88 mg/dL (ref 70–99)
Hemoglobin: 16 g/dL (ref 13.0–17.0)
Potassium: 4 mEq/L (ref 3.5–5.1)

## 2010-12-25 LAB — CBC
HCT: 37.5 % — ABNORMAL LOW (ref 39.0–52.0)
HCT: 45.7 % (ref 39.0–52.0)
Hemoglobin: 13 g/dL (ref 13.0–17.0)
Hemoglobin: 13.7 g/dL (ref 13.0–17.0)
Hemoglobin: 13.8 g/dL (ref 13.0–17.0)
Hemoglobin: 15.8 g/dL (ref 13.0–17.0)
MCHC: 34.5 g/dL (ref 30.0–36.0)
MCHC: 34.5 g/dL (ref 30.0–36.0)
MCV: 87 fL (ref 78.0–100.0)
Platelets: 87 10*3/uL — ABNORMAL LOW (ref 150–400)
Platelets: 91 10*3/uL — ABNORMAL LOW (ref 150–400)
Platelets: 94 K/uL — ABNORMAL LOW (ref 150–400)
RBC: 4.65 MIL/uL (ref 4.22–5.81)
RBC: 5.26 MIL/uL (ref 4.22–5.81)
RDW: 13.2 % (ref 11.5–15.5)
RDW: 13.4 % (ref 11.5–15.5)
RDW: 13.5 % (ref 11.5–15.5)
WBC: 6 10*3/uL (ref 4.0–10.5)
WBC: 7.8 K/uL (ref 4.0–10.5)

## 2010-12-25 LAB — LIPID PANEL
LDL Cholesterol: 53 mg/dL (ref 0–99)
Total CHOL/HDL Ratio: 4.6 RATIO
VLDL: 48 mg/dL — ABNORMAL HIGH (ref 0–40)

## 2010-12-25 LAB — APTT
aPTT: 28 s (ref 24–37)
aPTT: 48 seconds — ABNORMAL HIGH (ref 24–37)

## 2010-12-25 LAB — COMPREHENSIVE METABOLIC PANEL
AST: 26 U/L (ref 0–37)
Albumin: 3.9 g/dL (ref 3.5–5.2)
Chloride: 112 mEq/L (ref 96–112)
Creatinine, Ser: 0.9 mg/dL (ref 0.4–1.5)
GFR calc Af Amer: >60 mL/min (ref >60)
Potassium: 3.8 mEq/L (ref 3.5–5.1)
Total Bilirubin: 0.5 mg/dL (ref 0.3–1.2)

## 2010-12-25 LAB — SEDIMENTATION RATE: Sed Rate: 6 mm/hr (ref 0–16)

## 2010-12-25 LAB — TSH: TSH: 0.844 u[IU]/mL (ref 0.350–4.500)

## 2010-12-25 LAB — RAPID URINE DRUG SCREEN, HOSP PERFORMED
Amphetamines: NOT DETECTED
Cocaine: NOT DETECTED
Opiates: NOT DETECTED
Tetrahydrocannabinol: NOT DETECTED

## 2010-12-25 LAB — MRSA PCR SCREENING: MRSA by PCR: NEGATIVE

## 2010-12-25 LAB — VITAMIN B12: Vitamin B-12: 412 pg/mL (ref 211–911)

## 2010-12-25 LAB — HOMOCYSTEINE: Homocysteine: 9.9 umol/L (ref 4.0–15.4)

## 2011-05-27 ENCOUNTER — Other Ambulatory Visit: Payer: Self-pay | Admitting: Internal Medicine

## 2012-03-05 ENCOUNTER — Telehealth: Payer: Self-pay | Admitting: Internal Medicine

## 2012-03-05 ENCOUNTER — Other Ambulatory Visit: Payer: Self-pay | Admitting: Internal Medicine

## 2012-03-05 DIAGNOSIS — E785 Hyperlipidemia, unspecified: Secondary | ICD-10-CM

## 2012-03-05 DIAGNOSIS — M109 Gout, unspecified: Secondary | ICD-10-CM

## 2012-03-05 DIAGNOSIS — T887XXA Unspecified adverse effect of drug or medicament, initial encounter: Secondary | ICD-10-CM

## 2012-03-05 DIAGNOSIS — Z0000 Encounter for general adult medical examination without abnormal findings: Secondary | ICD-10-CM

## 2012-03-05 NOTE — Telephone Encounter (Signed)
Orders placed.

## 2012-03-05 NOTE — Telephone Encounter (Signed)
Patient coming 7.12.13, for labs prior to CPE on 7.22.13 Patient will come here for draw, can you review and put in chart orders Thanks

## 2012-04-17 ENCOUNTER — Other Ambulatory Visit: Payer: Self-pay

## 2012-04-27 ENCOUNTER — Encounter: Payer: Self-pay | Admitting: Internal Medicine

## 2012-05-05 ENCOUNTER — Encounter: Payer: Self-pay | Admitting: Internal Medicine

## 2012-06-04 ENCOUNTER — Other Ambulatory Visit (INDEPENDENT_AMBULATORY_CARE_PROVIDER_SITE_OTHER): Payer: BC Managed Care – PPO

## 2012-06-04 DIAGNOSIS — R7309 Other abnormal glucose: Secondary | ICD-10-CM

## 2012-06-04 DIAGNOSIS — M109 Gout, unspecified: Secondary | ICD-10-CM

## 2012-06-04 DIAGNOSIS — E785 Hyperlipidemia, unspecified: Secondary | ICD-10-CM

## 2012-06-04 DIAGNOSIS — T887XXA Unspecified adverse effect of drug or medicament, initial encounter: Secondary | ICD-10-CM

## 2012-06-04 DIAGNOSIS — Z0000 Encounter for general adult medical examination without abnormal findings: Secondary | ICD-10-CM

## 2012-06-04 HISTORY — DX: Other abnormal glucose: R73.09

## 2012-06-04 LAB — CBC WITH DIFFERENTIAL/PLATELET
Basophils Relative: 0.4 % (ref 0.0–3.0)
Lymphocytes Relative: 32.7 % (ref 12.0–46.0)
MCHC: 32.6 g/dL (ref 30.0–36.0)
MCV: 85 fl (ref 78.0–100.0)
Monocytes Absolute: 0.4 10*3/uL (ref 0.1–1.0)
Neutrophils Relative %: 57.1 % (ref 43.0–77.0)
Platelets: 79 10*3/uL — ABNORMAL LOW (ref 150.0–400.0)
RBC: 5.52 Mil/uL (ref 4.22–5.81)

## 2012-06-04 LAB — BASIC METABOLIC PANEL
Calcium: 9.5 mg/dL (ref 8.4–10.5)
Creatinine, Ser: 1.1 mg/dL (ref 0.4–1.5)
GFR: 73.94 mL/min (ref >60.00)
Glucose, Bld: 112 mg/dL — ABNORMAL HIGH (ref 70–99)
Sodium: 137 mEq/L (ref 135–145)

## 2012-06-04 LAB — LIPID PANEL
HDL: 38.1 mg/dL — ABNORMAL LOW (ref >39.00)
Triglycerides: 108 mg/dL (ref 0.0–149.0)

## 2012-06-04 LAB — POCT URINALYSIS DIPSTICK
Bilirubin, UA: NEGATIVE
Blood, UA: NEGATIVE
Ketones, UA: NEGATIVE
pH, UA: 5

## 2012-06-04 LAB — HEPATIC FUNCTION PANEL
ALT: 25 U/L (ref 0–53)
AST: 23 U/L (ref 0–37)
Albumin: 4.6 g/dL (ref 3.5–5.2)
Alkaline Phosphatase: 69 U/L (ref 39–117)

## 2012-06-04 LAB — URIC ACID: Uric Acid, Serum: 5.3 mg/dL (ref 4.0–7.8)

## 2012-06-05 NOTE — Progress Notes (Signed)
Labs only

## 2012-06-12 ENCOUNTER — Encounter: Payer: Self-pay | Admitting: Internal Medicine

## 2012-06-12 ENCOUNTER — Ambulatory Visit (INDEPENDENT_AMBULATORY_CARE_PROVIDER_SITE_OTHER): Payer: BC Managed Care – PPO | Admitting: Internal Medicine

## 2012-06-12 VITALS — BP 118/80 | HR 64 | Temp 98.2°F | Resp 12 | Wt 200.0 lb

## 2012-06-12 DIAGNOSIS — Z23 Encounter for immunization: Secondary | ICD-10-CM

## 2012-06-12 DIAGNOSIS — M79609 Pain in unspecified limb: Secondary | ICD-10-CM

## 2012-06-12 DIAGNOSIS — R9431 Abnormal electrocardiogram [ECG] [EKG]: Secondary | ICD-10-CM | POA: Insufficient documentation

## 2012-06-12 DIAGNOSIS — E119 Type 2 diabetes mellitus without complications: Secondary | ICD-10-CM | POA: Insufficient documentation

## 2012-06-12 DIAGNOSIS — Z0000 Encounter for general adult medical examination without abnormal findings: Secondary | ICD-10-CM

## 2012-06-12 DIAGNOSIS — E785 Hyperlipidemia, unspecified: Secondary | ICD-10-CM

## 2012-06-12 DIAGNOSIS — Z2911 Encounter for prophylactic immunotherapy for respiratory syncytial virus (RSV): Secondary | ICD-10-CM

## 2012-06-12 MED ORDER — ALLOPURINOL 300 MG PO TABS: 300.0000 mg | ORAL_TABLET | Freq: Every day | ORAL | Status: DC

## 2012-06-12 MED ORDER — ONETOUCH DELICA LANCETS MISC: Status: DC

## 2012-06-12 MED ORDER — PRAVASTATIN SODIUM 40 MG PO TABS: 40.0000 mg | ORAL_TABLET | Freq: Every day | ORAL | Status: DC

## 2012-06-12 MED ORDER — GLUCOSE BLOOD VI STRP: ORAL_STRIP | Status: DC

## 2012-06-12 NOTE — Patient Instructions (Addendum)
Preventive Health Care: Exercise at least 30-45 minutes a day,  3-4 days a week.   Eat a low-fat diet with lots of fruits and vegetables, up to 7-9 servings per day. Consume less than 40 (preferably ZERO) grams of sugar per day from foods & drinks with High Fructose Corn Syrup (HFCS) sugar as #1,2,3 or # 4 on label.Whole Foods, [employer] & [employer] do not carry products with HFCS. Follow a  low carb nutrition program such as [place] or The New Sugar Busters  to prevent Diabetes progression . White carbohydrates (potatoes, rice, bread, and pasta) have a high spike of sugar and a high load of sugar. For example a  baked potato has a cup of sugar and a  french fry  2 teaspoons of sugar. Yams, wild  rice, whole grained bread &  wheat pasta have been much lower spike and load of  sugar. Portions should be the size of a deck of cards or your palm.   A1c assesses average 24 hour  glucose over prior 6-12 weeks. A1c GOALS:  Good diabetic control: 6.5-7 %  Fair diabetic control: 7-8 %  Poor diabetic control: greater than 8 % ( except with additional factors such as  advanced age; significant coronary or neurologic disease,etc). Check the A1c with Urine microalbumin in 4 mos. Goals for home glucose monitoring are : fasting  or morning glucose goal of  100-150. Two hours after any meal , goal = < 180, preferably < 160. Check fasting glucoses Monday, Wednesday, Friday, and Sunday. Check 2 hours after breakfast on Tuesday, after lunch Thursday, and after the meal on Saturday. Report any low blood glucoses immediately.  Please bring your diary of glucoses to that visit  If you activate My Chart; the results can be released to you as soon as they populate from the lab. If you choose not to use this program; the labs have to be reviewed, copied & mailed   causing a delay in getting the results to you.

## 2012-06-12 NOTE — Progress Notes (Signed)
Subjective:    Patient ID: Bryan Abbott, male    DOB: [birth year], 61 y.o.   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 Pain quality:sharp Pain severity: up to 4 Duration: only while weight bearing Radiation:no Exacerbating factors:running only for first 60 seconds; worse in shoes w/o arch  support Treatment/response:none   Review of Systems Constitutional: no fever, chills, sweats, change in weight  Musculoskeletal:no  muscle cramps or pain; no  joint stiffness, redness, or swelling Skin:no rash, color change Neuro: no weakness; incontinence (stool/urine); numbness and tingling Heme:no lymphadenopathy; abnormal bruising or bleeding        Objective:   Physical Exam Gen.: Healthy and well-nourished in appearance. Alert, appropriate and cooperative throughout exam. Head: Normocephalic without obvious abnormalities;  pattern alopecia  Eyes: No corneal or conjunctival inflammation noted. Pupils equal round reactive to light and accommodation. Fundal exam is benign without hemorrhages, exudate, papilledema. Extraocular motion intact. Vision grossly normal. Ears: External  ear exam reveals no significant lesions or deformities. Canals clear .TMs normal. Hearing is grossly normal bilaterally. Nose: External nasal exam reveals no deformity or inflammation. Nasal mucosa are pink and moist. No lesions or exudates noted.  Mouth: Oral mucosa and oropharynx reveal no lesions or exudates. Teeth in good repair. Neck: No deformities, masses, or tenderness noted. Range of motion & Thyroid normal. Lungs: Normal respiratory effort; chest expands symmetrically. Lungs are clear to auscultation without rales, wheezes, or increased work of breathing. Heart: Normal rate and rhythm. Normal S1 and S2. No gallop, click, or  rub. S 4 w/o  murmur. Abdomen: Bowel sounds normal; abdomen soft and nontender. No masses, organomegaly or hernias noted. Genitalia/ DRE: Genitalia normal except for left varices. Prostate is normal without enlargement, asymmetry, nodularity, or induration.                                                      Musculoskeletal/extremities: No deformity or scoliosis noted of  the thoracic or lumbar spine. No clubbing, cyanosis, edema, or deformity noted. Range of motion  normal .Tone & strength  normal.Joints normal. Nail health  good. He has no tenderness or pain with compression or deep palpation of the lateral aspect of the right foot. Vascular: Carotid, radial artery, dorsalis pedis and  posterior tibial pulses are full and equal. No bruits present. Neurologic: Alert and oriented x3. Deep tendon reflexes symmetrical and normal.          Skin: Intact without suspicious lesions . There is a drying lesion on the medial arch of the left foot. There is no evidence of cellulitis, ulcer, or vesicles at this time. Lymph: No cervical, axillary, or inguinal lymphadenopathy present. Psych: Mood and affect are normal. Normally interactive  Assessment & Plan:  #1 comprehensive physical exam; no acute findings #2 see Problem List with Assessments & Recommendations  #3 soft tissue pain lateral aspect of right foot. Exercises and footwear discussed. Plan: see Orders

## 2012-08-27 ENCOUNTER — Encounter: Payer: Self-pay | Admitting: Internal Medicine

## 2012-10-12 ENCOUNTER — Other Ambulatory Visit (INDEPENDENT_AMBULATORY_CARE_PROVIDER_SITE_OTHER): Payer: BC Managed Care – PPO

## 2012-10-12 DIAGNOSIS — E119 Type 2 diabetes mellitus without complications: Secondary | ICD-10-CM

## 2012-10-12 LAB — MICROALBUMIN / CREATININE URINE RATIO
Creatinine,U: 101.1 mg/dL
Microalb, Ur: 0.6 mg/dL (ref 0.0–1.9)

## 2012-10-12 NOTE — Progress Notes (Signed)
LABS ONLY  

## 2012-10-15 ENCOUNTER — Encounter: Payer: Self-pay | Admitting: Internal Medicine

## 2012-10-15 ENCOUNTER — Ambulatory Visit (INDEPENDENT_AMBULATORY_CARE_PROVIDER_SITE_OTHER): Payer: BC Managed Care – PPO | Admitting: Internal Medicine

## 2012-10-15 VITALS — BP 116/70 | HR 75 | Wt 182.6 lb

## 2012-10-15 DIAGNOSIS — R7309 Other abnormal glucose: Secondary | ICD-10-CM

## 2012-10-15 DIAGNOSIS — Z1211 Encounter for screening for malignant neoplasm of colon: Secondary | ICD-10-CM

## 2012-10-15 DIAGNOSIS — E785 Hyperlipidemia, unspecified: Secondary | ICD-10-CM

## 2012-10-15 DIAGNOSIS — Z23 Encounter for immunization: Secondary | ICD-10-CM

## 2012-10-15 DIAGNOSIS — E119 Type 2 diabetes mellitus without complications: Secondary | ICD-10-CM

## 2012-10-15 DIAGNOSIS — I7771 Dissection of carotid artery: Secondary | ICD-10-CM

## 2012-10-15 NOTE — Assessment & Plan Note (Signed)
There's been no followup since 2011. He's having no neurologic symptoms. Baseline cardiovascular thoracic surgery consultation will be obtained. Blood pressure is well controlled. As noted he is now nondiabetic

## 2012-10-15 NOTE — Patient Instructions (Addendum)
Preventive Health Care: Exercise at least 30-45 minutes a day,  3-4 days a week.  Eat a low-fat diet with lots of fruits and vegetables, up to 7-9 servings per day.  Consume less than 40 grams of sugar per day from foods & drinks with High Fructose Corn Sugar as #1,2,3 or # 4 on label. Please  schedule fasting Labs in 6 months : BMET,Lipids, hepatic panel, A1c, TSH,uric acid. PLEASE BRING THESE INSTRUCTIONS TO FOLLOW UP  LAB APPOINTMENT.This will guarantee correct labs are drawn, eliminating need for repeat blood sampling ( needle sticks ! ). Diagnoses /Codes: 272.4,790.29,274.9   Review and correct the record as indicated. Please share record with all medical staff seen.

## 2012-10-15 NOTE — Assessment & Plan Note (Signed)
LDL has been at minimal goal of less than 100. Ideal would be less than 70. He'll be asked to place his pill bottle by his toothbrush to enhance better adherence. Lipids will be rechecked in 6 months

## 2012-10-15 NOTE — Assessment & Plan Note (Signed)
A1c now in NON Diabetic range with diet & exercise

## 2012-10-15 NOTE — Assessment & Plan Note (Signed)
With lifestyle change; he is now in the nondiabetic range. His long-term cardiovascular risk has been decreased by over 50%

## 2012-10-15 NOTE — Progress Notes (Signed)
Subjective:    Patient ID: Bryan Abbott, male    DOB: 11/16/1950, 62 y.o.   MRN: [id number]  HPI The patient is here for followup of diabetes & hyperlipidemia.  The most recent A1c of 5.5%   Was 10/12/12  , which correlates to an average sugar of 111 , and long-term risk of 15% . Fasting blood sugar ranges  85-118    .  Highest two-hour postprandial glucose is < 140 . No hypoglycemia Last ophthalmologic examination Fall 2013  revealed no retinopathy. No active podiatry assessment on record. Diet is modified [facility] . Exercise decreased for past month due to cold .  The most recent lipids 8/13  reveal LDL 81  , HDL 38.1 , and triglycerides 108   . The statin is taken 5-6 nights / week.     Review of Systems Constitutional: No fever, chills,   fatigue, weakness or night sweats. Weight down 35 over 10 mos Eyes: No  blurred vision, double vision, or loss of vision Cardiovascular: no chest pain, palpitations, racing, irregular rhythm,syncope,nausea,sweating, claudication, or edema  Respiratory: No exertinal dyspnea, paroxysmal nocturnal dyspnea Gastrointestinal: No heartburn,dysphagia, diarrhea, significant constipation, rectal bleeding, melena. Colonoscopy ? status Musculoskeletal: Some nocturnal calf cramping intermittently.No weakness or cyanosis Dermatologic: No rash, pruritus, urticaria, or change in color or temperature of skin Neurologic: No  limb numbness or tingling Endocrine: No change in hair/skin/ nails, excessive thirst, excessive hunger,or  excessive urination        Objective:   Physical Exam Gen.:  well-nourished in appearance. Alert, appropriate and cooperative throughout exam. Eyes: No corneal or conjunctival inflammation noted.  Vision grossly normal with lenses. Neck: No deformities, masses, or tenderness noted.  Thyroid normal. Lungs: Normal respiratory effort; chest expands symmetrically. Lungs are clear to auscultation without rales, wheezes, or increased  work of breathing. Heart: Normal rate and rhythm. Normal S1 and S2. No gallop, click, or rub. No murmur. Abdomen: Bowel sounds normal; abdomen soft and nontender. No masses, organomegaly or hernias noted.                                                                              Musculoskeletal/extremities:  No clubbing, cyanosis, edema, or deformity noted. Range of motion  normal .Tone & strength  normal.Joints normal. Nail health  Good.Toenails excessively long Vascular: Carotid, radial artery, dorsalis pedis and  posterior tibial pulses are full and equal. No bruits present. Neurologic: Alert and oriented x3. Deep tendon reflexes symmetrical and normal.   Light touch normal over feet.         Skin: Intact without suspicious lesions or rashes. Lymph: No cervical, axillary lymphadenopathy present. Psych: Mood and affect are normal. Normally interactive  Assessment & Plan:

## 2012-10-16 ENCOUNTER — Ambulatory Visit: Payer: BC Managed Care – PPO | Admitting: Internal Medicine

## 2012-10-16 ENCOUNTER — Other Ambulatory Visit: Payer: Self-pay | Admitting: *Deleted

## 2012-10-16 DIAGNOSIS — I6529 Occlusion and stenosis of unspecified carotid artery: Secondary | ICD-10-CM

## 2012-10-16 DIAGNOSIS — I6789 Other cerebrovascular disease: Secondary | ICD-10-CM

## 2012-10-16 DIAGNOSIS — I7771 Dissection of carotid artery: Secondary | ICD-10-CM

## 2012-11-03 ENCOUNTER — Encounter: Payer: Self-pay | Admitting: Vascular Surgery

## 2012-11-04 ENCOUNTER — Encounter: Payer: Self-pay | Admitting: Vascular Surgery

## 2012-11-04 ENCOUNTER — Other Ambulatory Visit (INDEPENDENT_AMBULATORY_CARE_PROVIDER_SITE_OTHER): Payer: BC Managed Care – PPO | Admitting: *Deleted

## 2012-11-04 ENCOUNTER — Ambulatory Visit (INDEPENDENT_AMBULATORY_CARE_PROVIDER_SITE_OTHER): Payer: BC Managed Care – PPO | Admitting: Vascular Surgery

## 2012-11-04 VITALS — BP 111/69 | HR 64 | Ht 71.5 in | Wt 183.9 lb

## 2012-11-04 DIAGNOSIS — I6789 Other cerebrovascular disease: Secondary | ICD-10-CM

## 2012-11-04 DIAGNOSIS — I7771 Dissection of carotid artery: Secondary | ICD-10-CM | POA: Insufficient documentation

## 2012-11-04 DIAGNOSIS — I6529 Occlusion and stenosis of unspecified carotid artery: Secondary | ICD-10-CM

## 2012-11-04 NOTE — Progress Notes (Signed)
Vascular and Vein Specialist of [facility]  Patient name: Bryan Abbott MRN: [id number] DOB: [birth year] Sex: male  REASON FOR CONSULT: history of left carotid dissection. Referred by Dr. Alwyn Ren.  HPI: Bryan Abbott is a 62 y.o. 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 He had developed a sudden visual disturbance and headaches and had a small stroke associated with this. He states that he was on Coumadin for about 4-5 months and then this was stopped. Since that time he denies any history of stroke, TIAs, expressive or receptive aphasia, or amaurosis fugax. He was referred by Dr. Alwyn Ren for a follow up carotid duplex scan in vascular evaluation. He has a history of thrombocytopenia therefore he is not on aspirin.  Past Medical History  Diagnosis Date  . Carotid artery dissection 2011  . Hyperlipidemia   . Gout   . Other abnormal glucose 06/04/12    A1c 6,.7%   . Stroke     Family History  Problem Relation Age of Onset  . Diabetes Mother   . Diabetes Father   . Cancer Neg Hx   . Heart disease Neg Hx   . Stroke Neg Hx   . Hypertension Neg Hx   . Hyperlipidemia Neg Hx   . Other Father     aneurysm    SOCIAL HISTORY: History  Substance Use Topics  . Smoking status: Never Smoker   . Smokeless tobacco: Never Used  . Alcohol Use: 3.0 oz/week    4 Glasses of wine, 1 Shots of liquor per week    No Known Allergies  Current Outpatient Prescriptions  Medication Sig Dispense Refill  . allopurinol (ZYLOPRIM) 300 MG tablet Take 1 tablet (300 mg total) by mouth daily.  90 tablet  3  . pravastatin (PRAVACHOL) 40 MG tablet Take 1 tablet (40 mg total) by mouth at bedtime.  90 tablet  3  . glucose blood (ONE TOUCH ULTRA TEST) test strip 1 each by Other route. Check Blood sugar daily as directed  100 each  3  . ONETOUCH DELICA LANCETS MISC Check Blood sugar daily as directed  100 each  3    REVIEW OF SYSTEMS: Arly.Keller ] denotes positive finding; [  ] denotes negative  finding  CARDIOVASCULAR:  [ ]  chest pain   [ ]  chest pressure   [ ]  palpitations   [ ]  orthopnea   [ ]  dyspnea on exertion   [ ]  claudication   [ ]  rest pain   [ ]  DVT   [ ]  phlebitis PULMONARY:   [ ]  productive cough   [ ]  asthma   [ ]  wheezing NEUROLOGIC:   [ ]  weakness  [ ]  paresthesias  [ ]  aphasia  [ ]  amaurosis  [ ]  dizziness HEMATOLOGIC:   [ ]  bleeding problems   [ ]  clotting disorders MUSCULOSKELETAL:  [ ]  joint pain   [ ]  joint swelling [ ]  leg swelling GASTROINTESTINAL: [ ]   blood in stool  [ ]   hematemesis GENITOURINARY:  [ ]   dysuria  [ ]   hematuria PSYCHIATRIC:  [ ]  history of major depression INTEGUMENTARY:  [ ]  rashes  [ ]  ulcers CONSTITUTIONAL:  [ ]  fever   [ ]  chills  PHYSICAL EXAM: Filed Vitals:   11/04/12 1447 11/04/12 1451  BP: 131/73 111/69  Pulse: 64   Height: 5' 11.5" (1.816 m)   Weight: 183 lb 14.4 oz (83.416 kg)   SpO2: 100%    Body mass index is  25.29 kg/(m^2). GENERAL: The patient is a well-nourished male, in no acute distress. The vital signs are documented above. CARDIOVASCULAR: There is a regular rate and rhythm. I do not detect carotid bruits. He has no significant lower extremity swelling. PULMONARY: There is good air exchange bilaterally without wheezing or rales. ABDOMEN: Soft and non-tender with normal pitched bowel sounds.  MUSCULOSKELETAL: There are no major deformities or cyanosis. NEUROLOGIC: No focal weakness or paresthesias are detected. SKIN: There are no ulcers or rashes noted. PSYCHIATRIC: The patient has a normal affect.  DATA:  I have independently interpreted his carotid duplex scan which shows that his left internal carotid artery is occluded. He has mildly elevated velocities in the right with a less than 40% stenosis. Both vertebral arteries are patent with normally directed flow.  I reviewed his previous records from 2011. At that time he had a left carotid dissection with an occlusion of the left internal carotid artery by CT  angiogram.  MEDICAL ISSUES:  Internal carotid artery dissection This patient had a left carotid artery dissection back in 2011 that was treated with Coumadin for approximately 4-5 months according to the patient. Given his history I think it would be reasonable to get a carotid duplex scan on a yearly basis to keep an eye on the right carotid artery. The study today shows a less than 40% stenosis. He is asymptomatic. I have ordered a fall carotid duplex scan in 1 year which can be done in our office. He knows to call sooner if he has any problems. We have reviewed the symptoms of cerebrovascular disease.   Devlyn Parish S [facility] Specialists of [place] Beeper: [phone]

## 2012-11-04 NOTE — Assessment & Plan Note (Signed)
This patient had a left carotid artery dissection back in 2011 that was treated with Coumadin for approximately 4-5 months according to the patient. Given his history I think it would be reasonable to get a carotid duplex scan on a yearly basis to keep an eye on the right carotid artery. The study today shows a less than 40% stenosis. He is asymptomatic. I have ordered a fall carotid duplex scan in 1 year which can be done in our office. He knows to call sooner if he has any problems. We have reviewed the symptoms of cerebrovascular disease.

## 2012-11-05 NOTE — Addendum Note (Signed)
Addended by: Sharee Pimple on: 11/05/2012 08:15 AM   Modules accepted: Orders

## 2013-04-14 ENCOUNTER — Other Ambulatory Visit (INDEPENDENT_AMBULATORY_CARE_PROVIDER_SITE_OTHER): Payer: BC Managed Care – PPO

## 2013-04-14 DIAGNOSIS — R7309 Other abnormal glucose: Secondary | ICD-10-CM

## 2013-04-14 DIAGNOSIS — M109 Gout, unspecified: Secondary | ICD-10-CM

## 2013-04-14 DIAGNOSIS — E785 Hyperlipidemia, unspecified: Secondary | ICD-10-CM

## 2013-04-14 LAB — LIPID PANEL
Cholesterol: 134 mg/dL (ref 0–200)
HDL: 38 mg/dL — ABNORMAL LOW (ref >39.00)
Triglycerides: 62 mg/dL (ref 0.0–149.0)
VLDL: 12.4 mg/dL (ref 0.0–40.0)

## 2013-04-14 LAB — BASIC METABOLIC PANEL
GFR: 74.53 mL/min (ref >60.00)
Glucose, Bld: 92 mg/dL (ref 70–99)
Potassium: 4.9 mEq/L (ref 3.5–5.1)
Sodium: 142 mEq/L (ref 135–145)

## 2013-04-14 LAB — HEPATIC FUNCTION PANEL
ALT: 18 U/L (ref 0–53)
Albumin: 4.7 g/dL (ref 3.5–5.2)
Total Protein: 7.5 g/dL (ref 6.0–8.3)

## 2013-04-14 LAB — HEMOGLOBIN A1C: Hgb A1c MFr Bld: 5.6 % (ref 4.6–6.5)

## 2013-04-14 LAB — TSH: TSH: 1.09 u[IU]/mL (ref 0.35–5.50)

## 2013-04-18 ENCOUNTER — Encounter: Payer: Self-pay | Admitting: Internal Medicine

## 2013-06-07 ENCOUNTER — Encounter: Payer: Self-pay | Admitting: Internal Medicine

## 2013-06-08 ENCOUNTER — Other Ambulatory Visit: Payer: Self-pay | Admitting: *Deleted

## 2013-06-08 DIAGNOSIS — M109 Gout, unspecified: Secondary | ICD-10-CM

## 2013-06-08 DIAGNOSIS — E785 Hyperlipidemia, unspecified: Secondary | ICD-10-CM

## 2013-06-08 MED ORDER — ALLOPURINOL 300 MG PO TABS: 300.0000 mg | ORAL_TABLET | Freq: Every day | ORAL | Status: DC

## 2013-06-08 MED ORDER — PRAVASTATIN SODIUM 40 MG PO TABS: 40.0000 mg | ORAL_TABLET | Freq: Every day | ORAL | Status: DC

## 2013-06-08 NOTE — Telephone Encounter (Signed)
RF for pravastatin and allopurinol sent to [facility]

## 2013-08-12 ENCOUNTER — Other Ambulatory Visit: Payer: Self-pay

## 2013-11-03 ENCOUNTER — Other Ambulatory Visit (HOSPITAL_COMMUNITY): Payer: BC Managed Care – PPO

## 2013-11-03 ENCOUNTER — Ambulatory Visit: Payer: BC Managed Care – PPO | Admitting: Family

## 2013-11-11 ENCOUNTER — Encounter: Payer: Self-pay | Admitting: Family

## 2013-11-12 ENCOUNTER — Ambulatory Visit (HOSPITAL_COMMUNITY)
Admission: RE | Admit: 2013-11-12 | Discharge: 2013-11-12 | Disposition: A | Discharge status: Home / Self Care | Payer: BC Managed Care – PPO | Source: Ambulatory Visit | Attending: Family | Admitting: Family

## 2013-11-12 ENCOUNTER — Ambulatory Visit (INDEPENDENT_AMBULATORY_CARE_PROVIDER_SITE_OTHER): Payer: BC Managed Care – PPO | Admitting: Family

## 2013-11-12 ENCOUNTER — Encounter: Payer: Self-pay | Admitting: Family

## 2013-11-12 VITALS — BP 115/75 | HR 63 | Resp 16 | Ht 72.0 in | Wt 200.0 lb

## 2013-11-12 DIAGNOSIS — I658 Occlusion and stenosis of other precerebral arteries: Secondary | ICD-10-CM | POA: Insufficient documentation

## 2013-11-12 DIAGNOSIS — I7771 Dissection of carotid artery: Secondary | ICD-10-CM

## 2013-11-12 DIAGNOSIS — Z8673 Personal history of transient ischemic attack (TIA), and cerebral infarction without residual deficits: Secondary | ICD-10-CM | POA: Insufficient documentation

## 2013-11-12 DIAGNOSIS — I6529 Occlusion and stenosis of unspecified carotid artery: Secondary | ICD-10-CM | POA: Insufficient documentation

## 2013-11-12 NOTE — Progress Notes (Signed)
Established Carotid Patient   History of Present Illness  Bryan Abbott is a 63 y.o. male patient of Dr. 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 He had developed a sudden visual disturbance and headaches and had a small stroke associated with this. He states that he was on Coumadin for about 4-5 months and then this was stopped. Since that time he denies any history of stroke, TIAs, expressive or receptive aphasia, or amaurosis fugax. He was referred by Dr. Linna Darner for a follow up carotid duplex scan in vascular evaluation. He has a history of thrombocytopenia therefore he is not on aspirin.  Stroke in 2011 manifested as loss of vision, does not know if one or both eyes, and terrible headache, resolved after a couple days, he was hospitalized at [facility] Leonie Man was his neurologist. He denies claudication synmptoms, denies cardiac issues.  Patient has not had previous carotid artery intervention. Has low platelets, cannot take NSAID's.  Pt Diabetic: 2 years ago his AIC was 6.7, a year ago, decreased to 5.5 with weight loss Pt smoker: non-smoker  Pt meds include: Statin : Yes ASA: No: has low platelets Other anticoagulants/antiplatelets: no   Past Medical History  Diagnosis Date  . Carotid artery dissection 2011  . Hyperlipidemia   . Gout   . Other abnormal glucose 06/04/12    A1c 6,.7%   . Stroke 2011    Social History History  Substance Use Topics  . Smoking status: Never Smoker   . Smokeless tobacco: Never Used  . Alcohol Use: 3.0 oz/week    4 Glasses of wine, 1 Shots of liquor per week    Family History Family History  Problem Relation Age of Onset  . Diabetes Mother   . Diabetes Father   . Other Father     aneurysm  . Aneurysm Father   . Cancer Neg Hx   . Heart disease Neg Hx   . Stroke Neg Hx   . Hypertension Neg Hx   . Hyperlipidemia Neg Hx     Surgical History Past Surgical History  Procedure Laterality Date  . Tonsillectomy and  adenoidectomy    . Cataract extraction      OD  . Colonoscopy  2004    [facility] ; negative. No FH of colon polyps or cancer  . Eye surgery      No Known Allergies  Current Outpatient Prescriptions  Medication Sig Dispense Refill  . allopurinol (ZYLOPRIM) 300 MG tablet Take 1 tablet (300 mg total) by mouth daily.  90 tablet  3  . pravastatin (PRAVACHOL) 40 MG tablet Take 1 tablet (40 mg total) by mouth at bedtime.  90 tablet  3  . glucose blood (ONE TOUCH ULTRA TEST) test strip 1 each by Other route. Check Blood sugar daily as directed  100 each  3  . ONETOUCH DELICA LANCETS MISC Check Blood sugar daily as directed  100 each  3   No current facility-administered medications for this visit.    Review of Systems : See HPI for pertinent positives and negatives.  Physical Examination  Filed Vitals:   11/12/13 1034  BP: 115/75  Pulse: 63  Resp: 16   Filed Weights   11/12/13 1034  Weight: 200 lb (90.719 kg)   Body mass index is 27.12 kg/(m^2).   General: WDWN male in NAD GAIT: normal Eyes: PERRLA Pulmonary:  CTAB, Negative  Rales, Negative rhonchi, & Negative wheezing.  Cardiac: regular Rhythm ,  Negative detected Murmur.  VASCULAR EXAM Carotid Bruits Left Right   Negative Negative    Aorta is not palpable. Radial pulses are 2+ palpable and equal.                                                                                                                            LE Pulses LEFT RIGHT       POPLITEAL  not palpable   not palpable       POSTERIOR TIBIAL  not palpable   not palpable        DORSALIS PEDIS      ANTERIOR TIBIAL  palpable   palpable     Gastrointestinal: soft, nontender, BS WNL, no r/g,  negative masses.  Musculoskeletal: Negative muscle atrophy/wasting. M/S 5/5 throughout, Extremities without ischemic changes.  Neurologic: A&O X 3; Appropriate Affect ; SENSATION ;normal;  Speech is normal CN 2-12 intact, Pain and light touch intact in  extremities, Motor exam as listed above.   Non-Invasive Vascular Imaging CAROTID DUPLEX 11/12/2013   CEREBROVASCULAR DUPLEX EVALUATION    INDICATION: Follow up carotid artery disease, known left internal carotid artery occlusion    PREVIOUS INTERVENTION(S): History of dissection/CVA    DUPLEX EXAM: Carotid duplex    RIGHT  LEFT  Peak Systolic Velocities (cm/s) End Diastolic Velocities (cm/s) Plaque LOCATION Peak Systolic Velocities (cm/s) End Diastolic Velocities (cm/s) Plaque  124 33 - CCA PROXIMAL 110 16 -  111 31 - CCA MID 90 14 -  105 37 HT CCA DISTAL 91 13 -  141 22 - ECA 111 15   96 36 HT ICA PROXIMAL Occluded - HT  70 31 - ICA MID Occluded - HT  73 35 - ICA DISTAL Occluded - HT    0.86 ICA / CCA Ratio (PSV) N/A  Antegrade Vertebral Flow Antegrade  616 Brachial Systolic Pressure (mmHg) 073  Triphasic Brachial Artery Waveforms Triphasic    Plaque Morphology:  HM = Homogeneous, HT = Heterogeneous, CP = Calcific Plaque, SP = Smooth Plaque, IP = Irregular Plaque     ADDITIONAL FINDINGS:     IMPRESSION: 1 .Less than 40% right internal carotid artery stenosis. 2. Known left internal carotid artery occlusion    Compared to the previous exam:  No change.    Assessment: Bryan Abbott is a 63 y.o. male who presents with asymptomatic minimal right ICA stenosis and known left ICA occlusion. The  ICA stenosis is  Unchanged from previous exam.  Plan: Follow-up in 1 year with Carotid Duplex scan.   I discussed in depth with the patient the nature of atherosclerosis, and emphasized the importance of maximal medical management including strict control of blood pressure, blood glucose, and lipid levels, obtaining regular exercise, and continued cessation of smoking.  The patient is aware that without maximal medical management the underlying atherosclerotic disease process will progress, limiting the benefit of any interventions. The patient was given information about stroke  prevention and what  symptoms should prompt the patient to seek immediate medical care. Thank you for allowing [place] to participate in this patient's care.  Clemon Chambers, RN, MSN, FNP-C [facility]ascular and Vein Specialists of [place] Office: [phone]  Clinic Physician: Scot Dock  11/12/2013 11:02 AM

## 2013-11-12 NOTE — Patient Instructions (Signed)

## 2014-01-28 ENCOUNTER — Encounter: Payer: BC Managed Care – PPO | Admitting: Internal Medicine

## 2014-05-23 ENCOUNTER — Other Ambulatory Visit: Payer: Self-pay | Admitting: Internal Medicine

## 2014-06-30 ENCOUNTER — Encounter: Payer: Self-pay | Admitting: Internal Medicine

## 2014-08-16 ENCOUNTER — Other Ambulatory Visit (INDEPENDENT_AMBULATORY_CARE_PROVIDER_SITE_OTHER): Payer: BC Managed Care – PPO

## 2014-08-16 ENCOUNTER — Other Ambulatory Visit: Payer: Self-pay | Admitting: Internal Medicine

## 2014-08-16 ENCOUNTER — Ambulatory Visit (INDEPENDENT_AMBULATORY_CARE_PROVIDER_SITE_OTHER): Payer: BC Managed Care – PPO

## 2014-08-16 ENCOUNTER — Encounter: Payer: Self-pay | Admitting: Internal Medicine

## 2014-08-16 ENCOUNTER — Ambulatory Visit (INDEPENDENT_AMBULATORY_CARE_PROVIDER_SITE_OTHER): Payer: BC Managed Care – PPO | Admitting: Internal Medicine

## 2014-08-16 VITALS — BP 136/86 | HR 58 | Temp 98.5°F | Resp 14 | Wt 203.5 lb

## 2014-08-16 DIAGNOSIS — Z0189 Encounter for other specified special examinations: Secondary | ICD-10-CM

## 2014-08-16 DIAGNOSIS — Z23 Encounter for immunization: Secondary | ICD-10-CM

## 2014-08-16 DIAGNOSIS — R7301 Impaired fasting glucose: Secondary | ICD-10-CM

## 2014-08-16 DIAGNOSIS — Z0000 Encounter for general adult medical examination without abnormal findings: Secondary | ICD-10-CM

## 2014-08-16 DIAGNOSIS — N486 Induration penis plastica: Secondary | ICD-10-CM

## 2014-08-16 DIAGNOSIS — I7771 Dissection of carotid artery: Secondary | ICD-10-CM

## 2014-08-16 DIAGNOSIS — E785 Hyperlipidemia, unspecified: Secondary | ICD-10-CM

## 2014-08-16 LAB — CBC WITH DIFFERENTIAL/PLATELET
Basophils Absolute: 0 10*3/uL (ref 0.0–0.1)
Basophils Relative: 0.7 % (ref 0.0–3.0)
EOS ABS: 0.2 10*3/uL (ref 0.0–0.7)
Eosinophils Relative: 3.3 % (ref 0.0–5.0)
HCT: 46.8 % (ref 39.0–52.0)
HEMOGLOBIN: 15.8 g/dL (ref 13.0–17.0)
Lymphocytes Relative: 30.9 % (ref 12.0–46.0)
Lymphs Abs: 1.8 10*3/uL (ref 0.7–4.0)
MCHC: 33.7 g/dL (ref 30.0–36.0)
MCV: 84.7 fl (ref 78.0–100.0)
Monocytes Absolute: 0.4 10*3/uL (ref 0.1–1.0)
Monocytes Relative: 6.1 % (ref 3.0–12.0)
NEUTROS ABS: 3.5 10*3/uL (ref 1.4–7.7)
Neutrophils Relative %: 59 % (ref 43.0–77.0)
Platelets: 115 10*3/uL — ABNORMAL LOW (ref 150.0–400.0)
RBC: 5.53 Mil/uL (ref 4.22–5.81)
RDW: 13.9 % (ref 11.5–15.5)
WBC: 5.9 10*3/uL (ref 4.0–10.5)

## 2014-08-16 LAB — HEPATIC FUNCTION PANEL
ALBUMIN: 4.1 g/dL (ref 3.5–5.2)
ALT: 26 U/L (ref 0–53)
AST: 25 U/L (ref 0–37)
Alkaline Phosphatase: 70 U/L (ref 39–117)
BILIRUBIN TOTAL: 0.9 mg/dL (ref 0.2–1.2)
Bilirubin, Direct: 0.1 mg/dL (ref 0.0–0.3)
Total Protein: 7.8 g/dL (ref 6.0–8.3)

## 2014-08-16 LAB — BASIC METABOLIC PANEL
BUN: 20 mg/dL (ref 6–23)
CALCIUM: 9.9 mg/dL (ref 8.4–10.5)
CO2: 20 mEq/L (ref 19–32)
Chloride: 106 mEq/L (ref 96–112)
Creatinine, Ser: 1.1 mg/dL (ref 0.4–1.5)
GFR: 75.84 mL/min (ref >60.00)
GLUCOSE: 136 mg/dL — AB (ref 70–99)
Potassium: 4.6 mEq/L (ref 3.5–5.1)
SODIUM: 143 meq/L (ref 135–145)

## 2014-08-16 LAB — MICROALBUMIN / CREATININE URINE RATIO
CREATININE, U: 124.8 mg/dL
MICROALB UR: 0.3 mg/dL (ref 0.0–1.9)
MICROALB/CREAT RATIO: 0.2 mg/g (ref 0.0–30.0)

## 2014-08-16 LAB — HEMOGLOBIN A1C: Hgb A1c MFr Bld: 6.5 % (ref 4.6–6.5)

## 2014-08-16 LAB — TSH: TSH: 0.97 u[IU]/mL (ref 0.35–4.50)

## 2014-08-16 LAB — URIC ACID: Uric Acid, Serum: 5.3 mg/dL (ref 4.0–7.8)

## 2014-08-16 MED ORDER — GLUCOSE BLOOD VI STRP: ORAL_STRIP | Status: DC

## 2014-08-16 MED ORDER — ONETOUCH DELICA LANCETS 33G MISC: Status: AC

## 2014-08-16 NOTE — Progress Notes (Signed)
Subjective:    Patient ID: Bryan Abbott, male    DOB: 11-07-1950, 62 y.o.   MRN: [id number]  HPI   He  is here for a physical;acute issues include mild Peyronie's syndrome symptoms.   He is on a modified heart healthy diet. He does ingest red meat but not fried foods to excess.He has been back on statin daily X 2 weeks; previously statin taken  2-3 X/week.  He is not exercising on regular basis since September. Prior to that time he was running 2 miles every other day without cardio pulmonary symptoms  There is no family history of premature heart attack or stroke. His father did have a cervical aneurysm.  His last advance cholesterol testing was in 2006.LDL goal was less than 100, ideally less than 70.    Review of Systems   Chest pain, palpitations, tachycardia, exertional dyspnea, paroxysmal nocturnal dyspnea, claudication or edema are absent. He still has some pain with erections.       Objective:   Physical Exam  Pertinent or positive findings include: Minor crepitus of the knees without associated effusion Left testicular atrophy and small varices   Gen.: Healthy and well-nourished in appearance. Alert, appropriate and cooperative throughout exam. Appears younger than stated age  Head: Normocephalic without obvious abnormalities;  pattern alopecia  Eyes: No corneal or conjunctival inflammation noted. Pupils equal round reactive to light and accommodation. Extraocular motion intact. Fundal exam is benign without hemorrhages, exudate, papilledema.  Vision grossly normal with /w/o lenses Ears: External  ear exam reveals no significant lesions or deformities. Canals clear .TMs normal. Hearing is grossly normal bilaterally. Nose: External nasal exam reveals no deformity or inflammation. Nasal mucosa are pink and moist. No lesions or exudates noted.   Mouth: Oral mucosa and oropharynx reveal no lesions or exudates. Teeth in good repair. Neck: No deformities, masses, or  tenderness noted. Range of motion &. Thyroid normal. Lungs: Normal respiratory effort; chest expands symmetrically. Lungs are clear to auscultation without rales, wheezes, or increased work of breathing. Heart: Normal rate and rhythm. Normal S1 and S2. No gallop, click, or rub. No murmur. Abdomen: Bowel sounds normal; abdomen soft and nontender. No masses, organomegaly or hernias noted. Prostate is small without asymmetry,nodularity or induration                               Musculoskeletal/extremities: No deformity or scoliosis noted of  the thoracic or lumbar spine.  No clubbing, cyanosis, edema, or significant extremity  deformity noted. Range of motion normal .Tone & strength normal. Hand joints normal  Fingernail  health good. Able to lie down & sit up w/o help. Negative SLR bilaterally Vascular: Carotid, radial artery, dorsalis pedis and  posterior tibial pulses are full and equal. No bruits present. Neurologic: Alert and oriented x3. Deep tendon reflexes symmetrical and normal.  Gait normal .       Skin: Intact without suspicious lesions or rashes. Lymph: No cervical, axillary, or inguinal lymphadenopathy present. Psych: Mood and affect are normal. Normally interactive  Assessment & Plan:  #1 comprehensive physical exam; no acute findings  Plan: see Orders  & Recommendations

## 2014-08-16 NOTE — Progress Notes (Signed)
Pre visit review using our clinic review tool, if applicable. No additional management support is needed unless otherwise documented below in the visit note. 

## 2014-08-16 NOTE — Patient Instructions (Addendum)
Your next office appointment will be determined based upon review of your pending labs. Those instructions will be transmitted to you through My Chart . 

## 2014-08-18 LAB — NMR LIPOPROFILE WITH LIPIDS
Cholesterol, Total: 176 mg/dL (ref 100–199)
HDL PARTICLE NUMBER: 33.8 umol/L (ref >=30.5)
HDL SIZE: 8.1 nm — AB (ref >=9.2)
HDL-C: 38 mg/dL — AB (ref >39)
LARGE VLDL-P: 9.7 nmol/L — AB (ref <=2.7)
LDL (calc): 101 mg/dL — ABNORMAL HIGH (ref 0–99)
LDL Particle Number: 1490 nmol/L — ABNORMAL HIGH (ref <1000)
LDL SIZE: 20.2 nm (ref >=20.8)
LP-IR Score: 85 — ABNORMAL HIGH (ref <=45)
Small LDL Particle Number: 866 nmol/L — ABNORMAL HIGH (ref <=527)
Triglycerides: 186 mg/dL — ABNORMAL HIGH (ref 0–149)
VLDL Size: 53.3 nm — ABNORMAL HIGH (ref <=46.6)

## 2014-11-17 ENCOUNTER — Encounter: Payer: Self-pay | Admitting: Family

## 2014-11-18 ENCOUNTER — Ambulatory Visit (HOSPITAL_COMMUNITY)
Admission: RE | Admit: 2014-11-18 | Discharge: 2014-11-18 | Disposition: A | Discharge status: Home / Self Care | Payer: BLUE CROSS/BLUE SHIELD | Source: Ambulatory Visit | Attending: Family | Admitting: Family

## 2014-11-18 ENCOUNTER — Encounter: Payer: Self-pay | Admitting: Family

## 2014-11-18 ENCOUNTER — Ambulatory Visit (INDEPENDENT_AMBULATORY_CARE_PROVIDER_SITE_OTHER): Payer: BLUE CROSS/BLUE SHIELD | Admitting: Family

## 2014-11-18 VITALS — BP 110/72 | HR 63 | Resp 16 | Ht 72.0 in | Wt 195.0 lb

## 2014-11-18 DIAGNOSIS — I7771 Dissection of carotid artery: Secondary | ICD-10-CM | POA: Diagnosis not present

## 2014-11-18 DIAGNOSIS — I6521 Occlusion and stenosis of right carotid artery: Secondary | ICD-10-CM

## 2014-11-18 DIAGNOSIS — Z8673 Personal history of transient ischemic attack (TIA), and cerebral infarction without residual deficits: Secondary | ICD-10-CM

## 2014-11-18 DIAGNOSIS — I6522 Occlusion and stenosis of left carotid artery: Secondary | ICD-10-CM

## 2014-11-18 NOTE — Progress Notes (Signed)
Established Carotid Patient   History of Present Illness  Bryan Abbott is a 64 y.o. male patient of Dr. 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 He had developed a sudden visual disturbance and headaches and had a small stroke associated with this. He states that he was on Coumadin for about 4-5 months and then this was stopped. Since that time he denies any history of stroke, TIAs, expressive or receptive aphasia, or amaurosis fugax. He was referred by Dr. Linna Darner for a follow up carotid duplex scan in vascular evaluation. He has a history of thrombocytopenia therefore he is not on aspirin.  Stroke in 2011 manifested as loss of vision, does not know if one or both eyes, and terrible headache, resolved after a couple days, he was hospitalized at [facility] Leonie Man was his neurologist. He denies claudication synmptoms, denies cardiac issues.  Patient has not had previous carotid artery intervention. Has low platelets, cannot take NSAID's, states his thrombocytopenia has improved.  Pt Diabetic:  his AIC was 6.7 in 2013, a year ago, decreased to 5.5 with weight loss, last A1C was 6.5 per pt Pt smoker: non-smoker  Pt meds include: Statin : Yes ASA: No: has low platelets Other anticoagulants/antiplatelets: no  Past Medical History  Diagnosis Date  . Carotid artery dissection 2011  . Hyperlipidemia   . Gout   . Other abnormal glucose 06/04/12    A1c 6,.7%   . Stroke January 23, 2010    Social History History  Substance Use Topics  . Smoking status: Never Smoker   . Smokeless tobacco: Never Used  . Alcohol Use: 3.0 oz/week    4 Glasses of wine, 1 Shots of liquor per week    Family History Family History  Problem Relation Age of Onset  . Diabetes Mother   . Diabetes Father   . Aneurysm Father     cervical region  . Hypertension Father   . Cancer Neg Hx   . Heart disease Neg Hx   . Hyperlipidemia Neg Hx     Surgical History Past Surgical History  Procedure  Laterality Date  . Tonsillectomy and adenoidectomy      Age 59 or 64 years old  . Cataract extraction      OD  . Colonoscopy  2006    [facility] ; negative. No FH of colon polyps or cancer  . Vasectomy  1984-1985  . Eye surgery Right 2012    Cataract    No Known Allergies  Current Outpatient Prescriptions  Medication Sig Dispense Refill  . allopurinol (ZYLOPRIM) 300 MG tablet TAKE 1 TABLET DAILY 90 tablet 0  . glucose blood (ONE TOUCH ULTRA TEST) test strip 1 each by Other route. Check Blood sugar daily as directed ICD R73.01 100 each 3  . ONETOUCH DELICA LANCETS 58K MISC USE TO TEST BLOOD SUGAR DAILY ICD R73.01 100 each 3  . pravastatin (PRAVACHOL) 40 MG tablet TAKE 1 TABLET AT BEDTIME 90 tablet 0   No current facility-administered medications for this visit.    Review of Systems : See HPI for pertinent positives and negatives.  Physical Examination  Filed Vitals:   11/18/14 1158 11/18/14 1201  BP: 111/73 110/72  Pulse: 60 63  Resp:  16  Height:  6' (1.829 m)  Weight:  195 lb (88.451 kg)  SpO2:  99%   Body mass index is 26.44 kg/(m^2).   General: WDWN male in NAD GAIT: normal Eyes: PERRLA Pulmonary: CTAB, Negative Rales,  Negative rhonchi, & Negative wheezing.  Cardiac: regular Rhythm, no detected murmur.  VASCULAR EXAM Carotid Bruits Left Right   Negative Negative   Aorta is not palpable. Radial pulses are 2+ palpable and equal.      LE Pulses LEFT RIGHT   POPLITEAL not palpable  not palpable   POSTERIOR TIBIAL not palpable  not palpable    DORSALIS PEDIS  ANTERIOR TIBIAL palpable  palpable     Gastrointestinal: soft, nontender, BS WNL, no r/g,no palpable masses.  Musculoskeletal: Negative muscle atrophy/wasting. M/S 5/5 throughout, Extremities without ischemic  changes.  Neurologic: A&O X 3; Appropriate Affect ; SENSATION ;normal;  Speech is normal CN 2-12 intact, Pain and light touch intact in extremities, Motor exam as listed above.          Non-Invasive Vascular Imaging CAROTID DUPLEX 11/18/2014   CEREBROVASCULAR DUPLEX EVALUATION    INDICATION: Follow-up carotid disease     PREVIOUS INTERVENTION(S): Reported occlusion of left internal carotid artery     DUPLEX EXAM:     RIGHT  LEFT  Peak Systolic Velocities (cm/s) End Diastolic Velocities (cm/s) Plaque LOCATION Peak Systolic Velocities (cm/s) End Diastolic Velocities (cm/s) Plaque  126 31  CCA PROXIMAL 113 18   108 29  CCA MID 72 11   74 24  CCA DISTAL 86 12   123 23  ECA 101 17   80 25 HT ICA PROXIMAL 25 0   86 35  ICA MID 77 20 HT/HM  66 29  ICA DISTAL 52 17     1.1 ICA / CCA Ratio (PSV) NA  Antegrade  Vertebral Flow Antegrade    Brachial Systolic Pressure (mmHg)   Within normal limits  Brachial Artery Waveforms Within normal limits     Plaque Morphology:  HM = Homogeneous, HT = Heterogeneous, CP = Calcific Plaque, SP = Smooth Plaque, IP = Irregular Plaque     ADDITIONAL FINDINGS:     IMPRESSION: 1. Evidence of minimal (<40%) disease of the right internal carotid artery. 2. Previously reported occlusion of left internal carotid artery (suspected due to dissection) appears to be recanalizing. 3. Bilateral vertebral artery is normal antegrade.    Compared to the previous exam:  Flow is visualized in the left internal carotid artery which was previously reported to be occluded.      Assessment: Bryan Abbott is a 64 y.o. male who is s/p dissection of his left carotid artery in 2011 and has had no stroke activity since that time, has no residual neurological deficits. Today's carotid Duplex reveals minimal (<40%) disease of the right internal carotid artery. Previously reported occlusion of left internal carotid artery (suspected due to dissection) appears to be  recanalizing. Flow is visualized in the left internal carotid artery which was previously reported to be occluded (11/12/2013). However, left proximal ICA end diastolic velocity is 0, the velocities detected proximal and distal to this are likely inflow and back flow; the left ICA therefore remains occluded.  Plan: Based on today's carotid Duplex, HPI, and physical exam, and after discussing with Dr. Bridgett Larsson, patient is advised to follow-up in 1 year with Carotid Duplex.   I discussed in depth with the patient the nature of atherosclerosis, and emphasized the importance of maximal medical management including strict control of blood pressure, blood glucose, and lipid levels, obtaining regular exercise, and continued cessation of smoking.  The patient is aware that without maximal medical management the underlying atherosclerotic disease process will progress, limiting the benefit of any interventions.  The patient was given information about stroke prevention and what symptoms should prompt the patient to seek immediate medical care. Thank you for allowing [place] to participate in this patient's care.  Clemon Chambers, RN, MSN, FNP-C [facility] of [place] Office: [place] [facility] Physician: Bridgett Larsson  11/18/2014 12:00 PM

## 2014-11-18 NOTE — Patient Instructions (Signed)
Stroke Prevention Some medical conditions and behaviors are associated with an increased chance of having a stroke. You may prevent a stroke by making healthy choices and managing medical conditions. HOW CAN I REDUCE MY RISK OF HAVING A STROKE?   Stay physically active. Get at least 30 minutes of activity on most or all days.  Do not smoke. It may also be helpful to avoid exposure to secondhand smoke.  Limit alcohol use. Moderate alcohol use is considered to be:  No more than 2 drinks per day for men.  No more than 1 drink per day for nonpregnant women.  Eat healthy foods. This involves:  Eating 5 or more servings of fruits and vegetables a day.  Making dietary changes that address high blood pressure (hypertension), high cholesterol, diabetes, or obesity.  Manage your cholesterol levels.  Making food choices that are high in fiber and low in saturated fat, trans fat, and cholesterol may control cholesterol levels.  Take any prescribed medicines to control cholesterol as directed by your health care provider.  Manage your diabetes.  Controlling your carbohydrate and sugar intake is recommended to manage diabetes.  Take any prescribed medicines to control diabetes as directed by your health care provider.  Control your hypertension.  Making food choices that are low in salt (sodium), saturated fat, trans fat, and cholesterol is recommended to manage hypertension.  Take any prescribed medicines to control hypertension as directed by your health care provider.  Maintain a healthy weight.  Reducing calorie intake and making food choices that are low in sodium, saturated fat, trans fat, and cholesterol are recommended to manage weight.  Stop drug abuse.  Avoid taking birth control pills.  Talk to your health care provider about the risks of taking birth control pills if you are over 35 years old, smoke, get migraines, or have ever had a blood clot.  Get evaluated for sleep  disorders (sleep apnea).  Talk to your health care provider about getting a sleep evaluation if you snore a lot or have excessive sleepiness.  Take medicines only as directed by your health care provider.  For some people, aspirin or blood thinners (anticoagulants) are helpful in reducing the risk of forming abnormal blood clots that can lead to stroke. If you have the irregular heart rhythm of atrial fibrillation, you should be on a blood thinner unless there is a good reason you cannot take them.  Understand all your medicine instructions.  Make sure that other conditions (such as anemia or atherosclerosis) are addressed. SEEK IMMEDIATE MEDICAL CARE IF:   You have sudden weakness or numbness of the face, arm, or leg, especially on one side of the body.  Your face or eyelid droops to one side.  You have sudden confusion.  You have trouble speaking (aphasia) or understanding.  You have sudden trouble seeing in one or both eyes.  You have sudden trouble walking.  You have dizziness.  You have a loss of balance or coordination.  You have a sudden, severe headache with no known cause.  You have new chest pain or an irregular heartbeat. Any of these symptoms may represent a serious problem that is an emergency. Do not wait to see if the symptoms will go away. Get medical help at once. Call your local emergency services (911 in [place] Do not drive yourself to the hospital. Document Released: 10/31/2004 Document Revised: 02/07/2014 Document Reviewed: 03/26/2013 [employer] Patient Information 2015 [employer]. This information is not intended to replace advice given   to you by your health care provider. Make sure you discuss any questions you have with your health care provider.  

## 2014-11-21 NOTE — Addendum Note (Signed)
Addended by: Dorthula Rue L on: 11/21/2014 01:38 PM   Modules accepted: Orders

## 2015-02-14 ENCOUNTER — Telehealth: Payer: Self-pay | Admitting: Internal Medicine

## 2015-02-14 ENCOUNTER — Other Ambulatory Visit: Payer: Self-pay | Admitting: Internal Medicine

## 2015-02-14 DIAGNOSIS — R7301 Impaired fasting glucose: Secondary | ICD-10-CM

## 2015-02-14 NOTE — Telephone Encounter (Signed)
Pt called in wanted to know if he has to see Hop to just check his A1c or Can an order just be put in to get lab work done.

## 2015-02-14 NOTE — Telephone Encounter (Signed)
Patient advised.

## 2015-02-14 NOTE — Telephone Encounter (Signed)
Order entered

## 2015-02-15 ENCOUNTER — Other Ambulatory Visit (INDEPENDENT_AMBULATORY_CARE_PROVIDER_SITE_OTHER): Payer: BLUE CROSS/BLUE SHIELD

## 2015-02-15 DIAGNOSIS — R7301 Impaired fasting glucose: Secondary | ICD-10-CM | POA: Diagnosis not present

## 2015-02-15 LAB — HEMOGLOBIN A1C: Hgb A1c MFr Bld: 5.8 % (ref 4.6–6.5)

## 2015-04-28 ENCOUNTER — Telehealth: Payer: Self-pay | Admitting: Internal Medicine

## 2015-04-28 NOTE — Telephone Encounter (Signed)
Patient has a physical scheduled for 10/3 and he was wanting to come in for labs the week earlier. Please advise Best number for patient is [phone]

## 2015-05-01 ENCOUNTER — Other Ambulatory Visit: Payer: Self-pay | Admitting: Internal Medicine

## 2015-05-01 DIAGNOSIS — Z0000 Encounter for general adult medical examination without abnormal findings: Secondary | ICD-10-CM

## 2015-05-01 NOTE — Telephone Encounter (Signed)
Okay to put these labs in?

## 2015-05-01 NOTE — Telephone Encounter (Signed)
Orders entered

## 2015-05-01 NOTE — Telephone Encounter (Signed)
Pt informed

## 2015-05-02 ENCOUNTER — Encounter: Payer: BLUE CROSS/BLUE SHIELD | Admitting: Internal Medicine

## 2015-06-07 ENCOUNTER — Encounter: Payer: Self-pay | Admitting: Internal Medicine

## 2015-07-05 ENCOUNTER — Other Ambulatory Visit (INDEPENDENT_AMBULATORY_CARE_PROVIDER_SITE_OTHER): Payer: BLUE CROSS/BLUE SHIELD

## 2015-07-05 DIAGNOSIS — Z0189 Encounter for other specified special examinations: Secondary | ICD-10-CM

## 2015-07-05 DIAGNOSIS — Z0000 Encounter for general adult medical examination without abnormal findings: Secondary | ICD-10-CM

## 2015-07-05 LAB — CBC WITH DIFFERENTIAL/PLATELET
BASOS PCT: 0.8 % (ref 0.0–3.0)
Basophils Absolute: 0 10*3/uL (ref 0.0–0.1)
EOS PCT: 3.5 % (ref 0.0–5.0)
Eosinophils Absolute: 0.2 10*3/uL (ref 0.0–0.7)
HCT: 41.8 % (ref 39.0–52.0)
HEMOGLOBIN: 14.2 g/dL (ref 13.0–17.0)
LYMPHS ABS: 1.6 10*3/uL (ref 0.7–4.0)
Lymphocytes Relative: 35.8 % (ref 12.0–46.0)
MCHC: 34 g/dL (ref 30.0–36.0)
MCV: 85.1 fl (ref 78.0–100.0)
MONO ABS: 0.3 10*3/uL (ref 0.1–1.0)
Monocytes Relative: 7.4 % (ref 3.0–12.0)
Neutro Abs: 2.3 10*3/uL (ref 1.4–7.7)
Neutrophils Relative %: 52.5 % (ref 43.0–77.0)
Platelets: 110 10*3/uL — ABNORMAL LOW (ref 150.0–400.0)
RBC: 4.92 Mil/uL (ref 4.22–5.81)
RDW: 14.4 % (ref 11.5–15.5)
WBC: 4.5 10*3/uL (ref 4.0–10.5)

## 2015-07-05 LAB — BASIC METABOLIC PANEL
BUN: 24 mg/dL — AB (ref 6–23)
CHLORIDE: 107 meq/L (ref 96–112)
CO2: 24 meq/L (ref 19–32)
Calcium: 9.4 mg/dL (ref 8.4–10.5)
Creatinine, Ser: 0.99 mg/dL (ref 0.40–1.50)
GFR: 80.93 mL/min (ref >60.00)
GLUCOSE: 127 mg/dL — AB (ref 70–99)
POTASSIUM: 4.1 meq/L (ref 3.5–5.1)
Sodium: 140 mEq/L (ref 135–145)

## 2015-07-05 LAB — HEPATIC FUNCTION PANEL
ALT: 17 U/L (ref 0–53)
AST: 21 U/L (ref 0–37)
Albumin: 4.4 g/dL (ref 3.5–5.2)
Alkaline Phosphatase: 66 U/L (ref 39–117)
BILIRUBIN TOTAL: 0.7 mg/dL (ref 0.2–1.2)
Bilirubin, Direct: 0.1 mg/dL (ref 0.0–0.3)
Total Protein: 7.2 g/dL (ref 6.0–8.3)

## 2015-07-05 LAB — LIPID PANEL
Cholesterol: 141 mg/dL (ref 0–200)
HDL: 38.5 mg/dL — ABNORMAL LOW (ref >39.00)
LDL CALC: 85 mg/dL (ref 0–99)
NonHDL: 102.55
Total CHOL/HDL Ratio: 4
Triglycerides: 90 mg/dL (ref 0.0–149.0)
VLDL: 18 mg/dL (ref 0.0–40.0)

## 2015-07-05 LAB — TSH: TSH: 1.63 u[IU]/mL (ref 0.35–4.50)

## 2015-07-05 LAB — HEMOGLOBIN A1C: Hgb A1c MFr Bld: 6 % (ref 4.6–6.5)

## 2015-07-08 HISTORY — PX: OTHER SURGICAL HISTORY: SHX169

## 2015-07-10 ENCOUNTER — Ambulatory Visit (INDEPENDENT_AMBULATORY_CARE_PROVIDER_SITE_OTHER): Payer: BLUE CROSS/BLUE SHIELD | Admitting: Internal Medicine

## 2015-07-10 ENCOUNTER — Encounter: Payer: Self-pay | Admitting: Internal Medicine

## 2015-07-10 VITALS — BP 122/80 | HR 58 | Temp 98.3°F | Resp 16 | Ht 72.0 in | Wt 190.0 lb

## 2015-07-10 DIAGNOSIS — Z0000 Encounter for general adult medical examination without abnormal findings: Secondary | ICD-10-CM

## 2015-07-10 DIAGNOSIS — Z23 Encounter for immunization: Secondary | ICD-10-CM

## 2015-07-10 MED ORDER — KETOCONAZOLE 2 % EX CREA: 1.0000 "application " | TOPICAL_CREAM | Freq: Every day | CUTANEOUS | Status: DC

## 2015-07-10 NOTE — Patient Instructions (Addendum)
Nizoral once a day; blow dry with hair drier

## 2015-07-10 NOTE — Progress Notes (Signed)
Pre visit review using our clinic review tool, if applicable. No additional management support is needed unless otherwise documented below in the visit note. 

## 2015-07-10 NOTE — Progress Notes (Signed)
   Subjective:    Patient ID: Bryan Abbott, male    DOB: 28-Feb-1951, 64 y.o.   MRN: [id number]  HPI The patient is here for a physical to assess status of active health conditions.  PMH, FH, & Social History reviewed & updated.No change in [place] as recorded.   He states that he is "85%" compliant with his medicines. He makes a comment that at least that is" some improvement as it was only over 50%".  He does not eat excessive red meats, fried foods, or salt. He runs 2 miles 3 times a week. He has no associated cardiopulmonary symptoms.  He's never smoked. He has 2-3 alcoholic beverages per week.  He had a colonoscopy in 2006; a follow-up surveillance study is pending. He has no active GI symptoms.  Review of Systems   He previously saw Dr. Jeffie Pollock for painful erections in the context of Peyronie's disease. This has resolved.  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  Chest pain, palpitations, tachycardia, exertional dyspnea, paroxysmal nocturnal dyspnea, claudication or edema are absent. No unexplained weight loss, abdominal pain, significant dyspepsia, dysphagia, melena, rectal bleeding, or persistently small caliber stools. Dysuria, pyuria, hematuria, frequency, nocturia or polyuria are denied. Change in hair, skin, nails denied. No bowel changes of constipation or diarrhea. No intolerance to heat or cold.     Objective:   Physical Exam  Pertinent or positive findings include: Pattern alopecia is present. Bilateral ptosis is noted. The left carotid pulse is decreased in intensity to auscultation as well as to palpation. He has slight crepitus of the knees. The left testicle is slightly atrophic. Prostate is normal without enlargement, nodularity, or induration.  General appearance :adequately nourished; in no distress.  Eyes: No conjunctival inflammation or scleral icterus is present.  Oral exam:   Lips and gums are healthy appearing.There is no oropharyngeal erythema or exudate noted. Dental hygiene is good.  Heart:  Normal rate and regular rhythm. S1 and S2 normal without gallop, murmur, click, rub or other extra sounds    Lungs:Chest clear to auscultation; no wheezes, rhonchi,rales ,or rubs present.No increased work of breathing.   Abdomen: bowel sounds normal, soft and non-tender without masses, organomegaly or hernias noted.  No guarding or rebound. No flank tenderness to percussion.  Vascular : all pulses equal ; no bruits present.  Skin:Warm & dry.  Intact without suspicious lesions or rashes ; no tenting or jaundice. He has minimal inguinal crease dermatitis without maceration.  Lymphatic: No lymphadenopathy is noted about the head, neck, axilla, or inguinal areas.   Neuro: Strength, tone & DTRs normal.     Assessment & Plan:  #1 comprehensive physical exam; no acute findings  Plan: see Orders  & Recommendations

## 2015-07-18 ENCOUNTER — Encounter: Payer: BLUE CROSS/BLUE SHIELD | Admitting: Internal Medicine

## 2015-08-03 ENCOUNTER — Ambulatory Visit (AMBULATORY_SURGERY_CENTER): Payer: Self-pay | Admitting: *Deleted

## 2015-08-03 VITALS — Ht 72.0 in | Wt 190.0 lb

## 2015-08-03 DIAGNOSIS — Z1211 Encounter for screening for malignant neoplasm of colon: Secondary | ICD-10-CM

## 2015-08-03 NOTE — Progress Notes (Signed)
No egg or soy allergy No issues with past sedation No diet pills No home 02 use  emmi declined  

## 2015-08-17 ENCOUNTER — Encounter: Payer: Self-pay | Admitting: Internal Medicine

## 2015-08-17 ENCOUNTER — Ambulatory Visit (AMBULATORY_SURGERY_CENTER): Payer: BLUE CROSS/BLUE SHIELD | Admitting: Internal Medicine

## 2015-08-17 VITALS — BP 111/66 | HR 62 | Temp 96.0°F | Resp 24 | Ht 72.0 in | Wt 190.0 lb

## 2015-08-17 DIAGNOSIS — Z1211 Encounter for screening for malignant neoplasm of colon: Secondary | ICD-10-CM

## 2015-08-17 MED ORDER — SODIUM CHLORIDE 0.9 % IV SOLN: 500.0000 mL | INTRAVENOUS | Status: DC

## 2015-08-17 NOTE — Progress Notes (Signed)
Report to PACU, RN, vss, BBS= Clear.  

## 2015-08-17 NOTE — Op Note (Signed)
[place]  [address] [place], [id number]   COLONOSCOPY PROCEDURE REPORT  PATIENT: Bryan Abbott, Bryan Abbott  MR#: [id number] BIRTHDATE: [birth year] , 63  yrs. old GENDER: male ENDOSCOPIST: Gatha Mayer, MD, [facility] PROCEDURE DATE:  08/17/2015 PROCEDURE:   Colonoscopy, screening First Screening Colonoscopy - Avg.  risk and is 50 yrs.  old or older - No.  Prior Negative Screening - Now for repeat screening. 10 or more years since last screening  History of Adenoma - Now for follow-up colonoscopy & has been > or = to 3 yrs.  N/A  Polyps removed today? No Recommend repeat exam, <10 yrs? No ASA CLASS:   Class II INDICATIONS:Screening for colonic neoplasia and Colorectal Neoplasm Risk Assessment for this procedure is average risk. MEDICATIONS: Propofol 200 mg IV and Monitored anesthesia care  DESCRIPTION OF PROCEDURE:   After the risks benefits and alternatives of the procedure were thoroughly explained, informed consent was obtained.  The digital rectal exam revealed no abnormalities of the rectum, revealed no prostatic nodules, and revealed the prostate was not enlarged.   The [id number] endoscope was introduced through the anus and advanced to the cecum, which was identified by both the appendix and ileocecal valve. No adverse events experienced.   The quality of the prep was excellent.  (MiraLax was used)  The instrument was then slowly withdrawn as the colon was fully examined. Estimated blood loss is zero unless otherwise noted in this procedure report.      COLON FINDINGS: There was mild diverticulosis noted in the sigmoid colon.   The examination was otherwise normal.  Retroflexed views revealed no abnormalities. The time to cecum = 1.1 Withdrawal time = 10.7   The scope was withdrawn and the procedure completed. COMPLICATIONS: There were no immediate complications.  ENDOSCOPIC IMPRESSION: 1.   There was mild diverticulosis noted in the sigmoid  colon 2.   The examination was otherwise normal  RECOMMENDATIONS: Repeat colonoscopy/screening test in 10 years.  eSigned:  Gatha Mayer, MD, [facility] 08/17/2015 8:23 AM   cc: The Patient

## 2015-08-17 NOTE — Patient Instructions (Addendum)
No polyps again!  Next routine colonoscopy in 10 years - 2026  I appreciate the opportunity to care for you. Gatha Mayer, MD, FACG   YOU HAD AN ENDOSCOPIC PROCEDURE TODAY AT [facility]:   Refer to the procedure report that was given to you for any specific questions about what was found during the examination.  If the procedure report does not answer your questions, please call your gastroenterologist to clarify.  If you requested that your care partner not be given the details of your procedure findings, then the procedure report has been included in a sealed envelope for you to review at your convenience later.  YOU SHOULD EXPECT: Some feelings of bloating in the abdomen. Passage of more gas than usual.  Walking can help get rid of the air that was put into your GI tract during the procedure and reduce the bloating. If you had a lower endoscopy (such as a colonoscopy or flexible sigmoidoscopy) you may notice spotting of blood in your stool or on the toilet paper. If you underwent a bowel prep for your procedure, you may not have a normal bowel movement for a few days.  Please Note:  You might notice some irritation and congestion in your nose or some drainage.  This is from the oxygen used during your procedure.  There is no need for concern and it should clear up in a day or so.  SYMPTOMS TO REPORT IMMEDIATELY:   Following lower endoscopy (colonoscopy or flexible sigmoidoscopy):  Excessive amounts of blood in the stool  Significant tenderness or worsening of abdominal pains  Swelling of the abdomen that is new, acute  Fever of 100F or higher   Following upper endoscopy (EGD)  Vomiting of blood or coffee ground material  New chest pain or pain under the shoulder blades  Painful or persistently difficult swallowing  New shortness of breath  Fever of 100F or higher  Black, tarry-looking stools  For urgent or emergent issues, a gastroenterologist can be  reached at any hour by calling [phone].   DIET: Your first meal following the procedure should be a small meal and then it is ok to progress to your normal diet. Heavy or fried foods are harder to digest and may make you feel nauseous or bloated.  Likewise, meals heavy in dairy and vegetables can increase bloating.  Drink plenty of fluids but you should avoid alcoholic beverages for 24 hours.  ACTIVITY:  You should plan to take it easy for the rest of today and you should NOT DRIVE or use heavy machinery until tomorrow (because of the sedation medicines used during the test).    FOLLOW UP: Our staff will call the number listed on your records the next business day following your procedure to check on you and address any questions or concerns that you may have regarding the information given to you following your procedure. If we do not reach you, we will leave a message.  However, if you are feeling well and you are not experiencing any problems, there is no need to return our call.  We will assume that you have returned to your regular daily activities without incident.  If any biopsies were taken you will be contacted by phone or by letter within the next 1-3 weeks.  Please call us at [phone] if you have not heard about the biopsies in 3 weeks.    SIGNATURES/CONFIDENTIALITY: You and/or your care partner have signed paperwork  which will be entered into your electronic medical record.  These signatures attest to the fact that that the information above on your After Visit Summary has been reviewed and is understood.  Full responsibility of the confidentiality of this discharge information lies with you and/or your care-partner.  diverticulosis information given

## 2015-08-18 ENCOUNTER — Telehealth: Payer: Self-pay | Admitting: *Deleted

## 2015-08-18 NOTE — Telephone Encounter (Signed)
  Follow up Call-  Call back number 08/17/2015  Post procedure Call Back phone  # [phone]  Permission to leave phone message Yes     Patient questions:  Message left to call us if necessary.

## 2015-09-21 ENCOUNTER — Telehealth: Payer: Self-pay

## 2015-09-21 NOTE — Telephone Encounter (Signed)
Left Voice Mail for pt to call back.   RE: Flu Vaccine for 2016  

## 2015-11-14 ENCOUNTER — Encounter: Payer: Self-pay | Admitting: Family

## 2015-11-20 ENCOUNTER — Encounter: Payer: Self-pay | Admitting: Family

## 2015-11-20 ENCOUNTER — Ambulatory Visit (INDEPENDENT_AMBULATORY_CARE_PROVIDER_SITE_OTHER): Payer: BLUE CROSS/BLUE SHIELD | Admitting: Family

## 2015-11-20 ENCOUNTER — Ambulatory Visit (HOSPITAL_COMMUNITY)
Admission: RE | Admit: 2015-11-20 | Discharge: 2015-11-20 | Disposition: A | Discharge status: Home / Self Care | Payer: BLUE CROSS/BLUE SHIELD | Source: Ambulatory Visit | Attending: Family | Admitting: Family

## 2015-11-20 VITALS — BP 116/78 | HR 65 | Ht 72.0 in | Wt 205.0 lb

## 2015-11-20 DIAGNOSIS — I6521 Occlusion and stenosis of right carotid artery: Secondary | ICD-10-CM | POA: Diagnosis not present

## 2015-11-20 DIAGNOSIS — Z8673 Personal history of transient ischemic attack (TIA), and cerebral infarction without residual deficits: Secondary | ICD-10-CM

## 2015-11-20 DIAGNOSIS — I6522 Occlusion and stenosis of left carotid artery: Secondary | ICD-10-CM

## 2015-11-20 DIAGNOSIS — I7771 Dissection of carotid artery: Secondary | ICD-10-CM | POA: Diagnosis not present

## 2015-11-20 DIAGNOSIS — I6523 Occlusion and stenosis of bilateral carotid arteries: Secondary | ICD-10-CM | POA: Insufficient documentation

## 2015-11-20 NOTE — Patient Instructions (Signed)
Stroke Prevention Some medical conditions and behaviors are associated with an increased chance of having a stroke. You may prevent a stroke by making healthy choices and managing medical conditions. HOW CAN I REDUCE MY RISK OF HAVING A STROKE?   Stay physically active. Get at least 30 minutes of activity on most or all days.  Do not smoke. It may also be helpful to avoid exposure to secondhand smoke.  Limit alcohol use. Moderate alcohol use is considered to be:  No more than 2 drinks per day for men.  No more than 1 drink per day for nonpregnant women.  Eat healthy foods. This involves:  Eating 5 or more servings of fruits and vegetables a day.  Making dietary changes that address high blood pressure (hypertension), high cholesterol, diabetes, or obesity.  Manage your cholesterol levels.  Making food choices that are high in fiber and low in saturated fat, trans fat, and cholesterol may control cholesterol levels.  Take any prescribed medicines to control cholesterol as directed by your health care provider.  Manage your diabetes.  Controlling your carbohydrate and sugar intake is recommended to manage diabetes.  Take any prescribed medicines to control diabetes as directed by your health care provider.  Control your hypertension.  Making food choices that are low in salt (sodium), saturated fat, trans fat, and cholesterol is recommended to manage hypertension.  Ask your health care provider if you need treatment to lower your blood pressure. Take any prescribed medicines to control hypertension as directed by your health care provider.  If you are 18-39 years of age, have your blood pressure checked every 3-5 years. If you are 40 years of age or older, have your blood pressure checked every year.  Maintain a healthy weight.  Reducing calorie intake and making food choices that are low in sodium, saturated fat, trans fat, and cholesterol are recommended to manage  weight.  Stop drug abuse.  Avoid taking birth control pills.  Talk to your health care provider about the risks of taking birth control pills if you are over 35 years old, smoke, get migraines, or have ever had a blood clot.  Get evaluated for sleep disorders (sleep apnea).  Talk to your health care provider about getting a sleep evaluation if you snore a lot or have excessive sleepiness.  Take medicines only as directed by your health care provider.  For some people, aspirin or blood thinners (anticoagulants) are helpful in reducing the risk of forming abnormal blood clots that can lead to stroke. If you have the irregular heart rhythm of atrial fibrillation, you should be on a blood thinner unless there is a good reason you cannot take them.  Understand all your medicine instructions.  Make sure that other conditions (such as anemia or atherosclerosis) are addressed. SEEK IMMEDIATE MEDICAL CARE IF:   You have sudden weakness or numbness of the face, arm, or leg, especially on one side of the body.  Your face or eyelid droops to one side.  You have sudden confusion.  You have trouble speaking (aphasia) or understanding.  You have sudden trouble seeing in one or both eyes.  You have sudden trouble walking.  You have dizziness.  You have a loss of balance or coordination.  You have a sudden, severe headache with no known cause.  You have new chest pain or an irregular heartbeat. Any of these symptoms may represent a serious problem that is an emergency. Do not wait to see if the symptoms will   go away. Get medical help at once. Call your local emergency services (911 in [place] Do not drive yourself to the hospital.   This information is not intended to replace advice given to you by your health care provider. Make sure you discuss any questions you have with your health care provider.   Document Released: 10/31/2004 Document Revised: 10/14/2014 Document Reviewed:  03/26/2013 Elsevier Interactive Patient Education 2016 [employer]  

## 2015-11-20 NOTE — Progress Notes (Signed)
Chief Complaint: Extracranial Carotid Artery Stenosis   History of Present Illness  Bryan Abbott is a 65 y.o. male patient of Dr. 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 He had developed a sudden visual disturbance and headaches and had a small stroke associated with this. He states that he was on Coumadin for about 4-5 months and then this was stopped. Since that time he denies any history of stroke, TIAs, expressive or receptive aphasia, or amaurosis fugax. He was referred by Dr. Linna Darner for a follow up carotid duplex scan in vascular evaluation. He has a history of thrombocytopenia therefore he is not on aspirin.  Stroke in 2011 manifested as loss of vision, does not know if one or both eyes, and terrible headache, resolved after a couple days, he was hospitalized at [facility] Leonie Man was his neurologist. He denies claudication synmptoms, denies cardiac issues.  Patient has not had previous carotid artery intervention. Has low platelets, cannot take NSAID's, states his thrombocytopenia has improved. Had left foot surgery October of 2016, bone spurs removed and hammer toes repaired by Dr. Barkley Bruns; states he will start running again.   Pt Diabetic: his AIC was 6.7 in 2013, decreased to 5.5 with weight loss, last A1C was 5.? per pt Pt smoker: non-smoker  Pt meds include: Statin : Yes, states he forgets to take regularly, states his PCP is aware ASA: No: has low platelets Other anticoagulants/antiplatelets: no   Past Medical History  Diagnosis Date  . Carotid artery dissection ([place]) 2011  . Hyperlipidemia   . Gout   . Other abnormal glucose 06/04/12    A1c 6,.7%   . Stroke [facility]) January 23, 2010    Carotid artery dissection  . Cataract     right and removed    Social History Social History  Substance Use Topics  . Smoking status: Never Smoker   . Smokeless tobacco: Never Used  . Alcohol Use: 1.8 oz/week    3 Glasses of wine per week    Family  History Family History  Problem Relation Age of Onset  . Diabetes Mother   . Diabetes Father   . Aneurysm Father     cervical region  . Hypertension Father   . Cancer Neg Hx   . Heart disease Neg Hx   . Hyperlipidemia Neg Hx   . Colon cancer Neg Hx   . Colon polyps Neg Hx   . Esophageal cancer Neg Hx   . Rectal cancer Neg Hx   . Stomach cancer Neg Hx     Surgical History Past Surgical History  Procedure Laterality Date  . Tonsillectomy and adenoidectomy      Age 42 or 65 years old  . Cataract extraction      OD  . Colonoscopy  2006    [facility] ; negative. No FH of colon polyps or cancer  . Vasectomy  1984-1985  . Eye surgery Right 2012    Cataract  . Bone spur and hammer toe surgery  07/2015    No Known Allergies  Current Outpatient Prescriptions  Medication Sig Dispense Refill  . allopurinol (ZYLOPRIM) 300 MG tablet TAKE 1 TABLET DAILY 90 tablet 0  . glucose blood (ONE TOUCH ULTRA TEST) test strip 1 each by Other route. Check Blood sugar daily as directed ICD R73.01 100 each 3  . ONETOUCH DELICA LANCETS 99991111 MISC USE TO TEST BLOOD SUGAR DAILY ICD R73.01 100 each 3  . pravastatin (PRAVACHOL) 40 MG  tablet TAKE 1 TABLET AT BEDTIME 90 tablet 0  . ketoconazole (NIZORAL) 2 % cream Apply 1 application topically daily. (Patient not taking: Reported on 11/20/2015) 30 g 1   No current facility-administered medications for this visit.    Review of Systems : See HPI for pertinent positives and negatives.  Physical Examination  Filed Vitals:   11/20/15 1054 11/20/15 1055  BP: 127/82 116/78  Pulse: 65   Height: 6' (1.829 m)   Weight: 205 lb (92.987 kg)   SpO2: 98%    Body mass index is 27.8 kg/(m^2).  General: WDWN male in NAD GAIT: normal Eyes: PERRLA Pulmonary: CTAB, Negative Rales, Negative rhonchi, & Negative wheezing.  Cardiac: regular Rhythm, no detected murmur.  VASCULAR EXAM Carotid Bruits Left Right   Negative Negative   Aorta is not  palpable. Radial pulses are 2+ palpable and equal.      LE Pulses LEFT RIGHT   POPLITEAL not palpable  not palpable   POSTERIOR TIBIAL not palpable  not palpable    DORSALIS PEDIS  ANTERIOR TIBIAL palpable  palpable     Gastrointestinal: soft, nontender, BS WNL, no r/g,no palpable masses.  Musculoskeletal: Negative muscle atrophy/wasting. M/S 5/5 throughout, Extremities without ischemic changes.  Neurologic: A&O X 3; Appropriate Affect ; SENSATION ;normal;  Speech is normal CN 2-12 intact, Pain and light touch intact in extremities, Motor exam as listed above.                Non-Invasive Vascular Imaging CAROTID DUPLEX 11/20/2015   CEREBROVASCULAR DUPLEX EVALUATION    INDICATION: Follow-up carotid disease     PREVIOUS INTERVENTION(S): Reported occlusion of left internal carotid artery     DUPLEX EXAM:     RIGHT  LEFT  Peak Systolic Velocities (cm/s) End Diastolic Velocities (cm/s) Plaque LOCATION Peak Systolic Velocities (cm/s) End Diastolic Velocities (cm/s) Plaque  119 27  CCA PROXIMAL 96 16   105 31  CCA MID 101 18   90 27  CCA DISTAL 88 16   127 20  ECA 108 20   73 15 HT ICA PROXIMAL 34 0   66 25  ICA MID 0  HT/HM  55 29  ICA DISTAL 0      .8 ICA / CCA Ratio (PSV) NA  Antegrade  Vertebral Flow Antegrade    Brachial Systolic Pressure (mmHg)   Within normal limits  Brachial Artery Waveforms Within normal limits     Plaque Morphology:  HM = Homogeneous, HT = Heterogeneous, CP = Calcific Plaque, SP = Smooth Plaque, IP = Irregular Plaque  ADDITIONAL FINDINGS:     IMPRESSION: 1. Evidence of minimal (<40%) disease of the right internal carotid artery. 2. Previously reported occlusion of left internal carotid artery (suspected due to dissection) appears to be recanalizing.  It is noted  that there is no flow seen in the mid to distal internal carotid artery. 3. Bilateral vertebral artery is normal antegrade.    Compared to the previous exam:  No significant changes noted compared to previous exam.      Assessment: Bryan Abbott is a 65 y.o. male who is s/p dissection of his left carotid artery in 2011, had a stroke at that time, and has had no stroke activity since that time, has no residual neurological deficits. Today's carotid Duplex reveals minimal (<40%) disease of the right internal carotid artery. Previously reported occlusion of left internal carotid artery (11/12/2013) (suspected due to dissection) appears to be recanalizing. It is noted that there  is no flow seen in the mid to distal internal carotid artery. Bilateral vertebral artery is normal antegrade.  However, left proximal ICA end diastolic velocity is 0, the velocities detected proximal to this are likely inflow and back flow; the left ICA therefore remains occluded.   Plan: Follow-up in 1 year with Carotid Duplex scan.   I discussed in depth with the patient the nature of atherosclerosis, and emphasized the importance of maximal medical management including strict control of blood pressure, blood glucose, and lipid levels, obtaining regular exercise, and continued cessation of smoking.  The patient is aware that without maximal medical management the underlying atherosclerotic disease process will progress, limiting the benefit of any interventions. The patient was given information about stroke prevention and what symptoms should prompt the patient to seek immediate medical care. Thank you for allowing [place] to participate in this patient's care.  Clemon Chambers, RN, MSN, FNP-C [facility] Office: [facility] Physician: Trula Slade   11/20/2015 10:58 AM

## 2015-11-22 NOTE — Addendum Note (Signed)
Addended by: Mena Goes on: 11/22/2015 10:08 AM   Modules accepted: Orders

## 2016-01-30 ENCOUNTER — Telehealth: Payer: Self-pay

## 2016-01-30 NOTE — Telephone Encounter (Signed)
Letter of Medical Clearance form sent for cataract extraction w/Intraocular Lens implantation of the Left eye.  Patient is no longer under provider care.  Faxed form back to [phone] stating same

## 2016-08-21 ENCOUNTER — Ambulatory Visit (INDEPENDENT_AMBULATORY_CARE_PROVIDER_SITE_OTHER): Payer: BLUE CROSS/BLUE SHIELD

## 2016-08-21 DIAGNOSIS — Z23 Encounter for immunization: Secondary | ICD-10-CM | POA: Diagnosis not present

## 2016-10-14 ENCOUNTER — Telehealth: Payer: Self-pay | Admitting: Internal Medicine

## 2016-10-14 DIAGNOSIS — R7301 Impaired fasting glucose: Secondary | ICD-10-CM

## 2016-10-14 DIAGNOSIS — E785 Hyperlipidemia, unspecified: Secondary | ICD-10-CM

## 2016-10-14 NOTE — Telephone Encounter (Signed)
Patient is requesting labs to be entered before appt.

## 2016-10-14 NOTE — Telephone Encounter (Signed)
Would you like the same labs entered that were done in 2016? I will order, please advise.

## 2016-10-14 NOTE — Telephone Encounter (Signed)
Ok to order same as 06/2015

## 2016-10-15 NOTE — Telephone Encounter (Signed)
Spoke with pt to inform.  

## 2016-10-25 ENCOUNTER — Other Ambulatory Visit (INDEPENDENT_AMBULATORY_CARE_PROVIDER_SITE_OTHER): Payer: BLUE CROSS/BLUE SHIELD

## 2016-10-25 DIAGNOSIS — E785 Hyperlipidemia, unspecified: Secondary | ICD-10-CM | POA: Diagnosis not present

## 2016-10-25 DIAGNOSIS — R7301 Impaired fasting glucose: Secondary | ICD-10-CM | POA: Diagnosis not present

## 2016-10-25 LAB — HEPATIC FUNCTION PANEL
ALT: 32 U/L (ref 0–53)
AST: 26 U/L (ref 0–37)
Albumin: 4.6 g/dL (ref 3.5–5.2)
Alkaline Phosphatase: 64 U/L (ref 39–117)
BILIRUBIN DIRECT: 0 mg/dL (ref 0.0–0.3)
BILIRUBIN TOTAL: 0.6 mg/dL (ref 0.2–1.2)
Total Protein: 7.7 g/dL (ref 6.0–8.3)

## 2016-10-25 LAB — BASIC METABOLIC PANEL
BUN: 20 mg/dL (ref 6–23)
CALCIUM: 9.7 mg/dL (ref 8.4–10.5)
CO2: 22 mEq/L (ref 19–32)
CREATININE: 1 mg/dL (ref 0.40–1.50)
Chloride: 106 mEq/L (ref 96–112)
GFR: 79.67 mL/min (ref >60.00)
GLUCOSE: 116 mg/dL — AB (ref 70–99)
Potassium: 4.5 mEq/L (ref 3.5–5.1)
Sodium: 139 mEq/L (ref 135–145)

## 2016-10-25 LAB — CBC WITH DIFFERENTIAL/PLATELET
BASOS PCT: 0.6 % (ref 0.0–3.0)
Basophils Absolute: 0 10*3/uL (ref 0.0–0.1)
EOS PCT: 3.3 % (ref 0.0–5.0)
Eosinophils Absolute: 0.2 10*3/uL (ref 0.0–0.7)
HEMATOCRIT: 44.1 % (ref 39.0–52.0)
HEMOGLOBIN: 15.1 g/dL (ref 13.0–17.0)
LYMPHS PCT: 34.8 % (ref 12.0–46.0)
Lymphs Abs: 1.8 10*3/uL (ref 0.7–4.0)
MCHC: 34.2 g/dL (ref 30.0–36.0)
MCV: 83.2 fl (ref 78.0–100.0)
Monocytes Absolute: 0.3 10*3/uL (ref 0.1–1.0)
Monocytes Relative: 6.6 % (ref 3.0–12.0)
NEUTROS ABS: 2.9 10*3/uL (ref 1.4–7.7)
Neutrophils Relative %: 54.7 % (ref 43.0–77.0)
PLATELETS: 115 10*3/uL — AB (ref 150.0–400.0)
RBC: 5.3 Mil/uL (ref 4.22–5.81)
RDW: 13.7 % (ref 11.5–15.5)
WBC: 5.3 10*3/uL (ref 4.0–10.5)

## 2016-10-25 LAB — LIPID PANEL
Cholesterol: 185 mg/dL (ref 0–200)
HDL: 36.3 mg/dL — ABNORMAL LOW (ref >39.00)
LDL CALC: 127 mg/dL — AB (ref 0–99)
NONHDL: 149.08
Total CHOL/HDL Ratio: 5
Triglycerides: 111 mg/dL (ref 0.0–149.0)
VLDL: 22.2 mg/dL (ref 0.0–40.0)

## 2016-10-25 LAB — TSH: TSH: 0.83 u[IU]/mL (ref 0.35–4.50)

## 2016-10-25 LAB — HEMOGLOBIN A1C: Hgb A1c MFr Bld: 6.8 % — ABNORMAL HIGH (ref 4.6–6.5)

## 2016-10-29 ENCOUNTER — Encounter: Payer: Self-pay | Admitting: Internal Medicine

## 2016-10-29 ENCOUNTER — Other Ambulatory Visit (INDEPENDENT_AMBULATORY_CARE_PROVIDER_SITE_OTHER): Payer: BLUE CROSS/BLUE SHIELD

## 2016-10-29 ENCOUNTER — Ambulatory Visit (INDEPENDENT_AMBULATORY_CARE_PROVIDER_SITE_OTHER): Payer: BLUE CROSS/BLUE SHIELD | Admitting: Internal Medicine

## 2016-10-29 VITALS — BP 114/80 | HR 68 | Temp 98.1°F | Resp 16 | Ht 72.0 in | Wt 202.0 lb

## 2016-10-29 DIAGNOSIS — Z8739 Personal history of other diseases of the musculoskeletal system and connective tissue: Secondary | ICD-10-CM

## 2016-10-29 DIAGNOSIS — Z23 Encounter for immunization: Secondary | ICD-10-CM

## 2016-10-29 DIAGNOSIS — Z0000 Encounter for general adult medical examination without abnormal findings: Secondary | ICD-10-CM

## 2016-10-29 DIAGNOSIS — C4490 Unspecified malignant neoplasm of skin, unspecified: Secondary | ICD-10-CM | POA: Insufficient documentation

## 2016-10-29 DIAGNOSIS — L719 Rosacea, unspecified: Secondary | ICD-10-CM

## 2016-10-29 DIAGNOSIS — Z1159 Encounter for screening for other viral diseases: Secondary | ICD-10-CM

## 2016-10-29 DIAGNOSIS — E7800 Pure hypercholesterolemia, unspecified: Secondary | ICD-10-CM

## 2016-10-29 DIAGNOSIS — I7771 Dissection of carotid artery: Secondary | ICD-10-CM

## 2016-10-29 DIAGNOSIS — E119 Type 2 diabetes mellitus without complications: Secondary | ICD-10-CM

## 2016-10-29 DIAGNOSIS — D696 Thrombocytopenia, unspecified: Secondary | ICD-10-CM

## 2016-10-29 LAB — URIC ACID: URIC ACID, SERUM: 8.1 mg/dL — AB (ref 4.0–7.8)

## 2016-10-29 LAB — HEPATITIS C ANTIBODY: HCV AB: NEGATIVE

## 2016-10-29 LAB — PSA: PSA: 2.3

## 2016-10-29 MED ORDER — PRAVASTATIN SODIUM 40 MG PO TABS: 40.0000 mg | ORAL_TABLET | Freq: Every day | ORAL | 3 refills | Status: AC

## 2016-10-29 MED ORDER — ALLOPURINOL 300 MG PO TABS: 300.0000 mg | ORAL_TABLET | Freq: Every day | ORAL | 3 refills | Status: AC

## 2016-10-29 MED ORDER — GLUCOSE BLOOD VI STRP: ORAL_STRIP | 3 refills | Status: AC

## 2016-10-29 NOTE — Patient Instructions (Addendum)
Test(s) ordered today. Your results will be released to [place] (or called to you) after review, usually within 72hours after test completion. If any changes need to be made, you will be notified at that same time.  All other Health Maintenance issues reviewed.   All recommended immunizations and age-appropriate screenings are up-to-date or discussed.  prevnar immunization administered today.   Medications reviewed and updated.  No changes recommended at this time.  Your prescription(s) have been submitted to your pharmacy. Please take as directed and contact our office if you believe you are having problem(s) with the medication(s).   Please followup in 6 months  Health Maintenance, Male A healthy lifestyle and preventative care can promote health and wellness.  Maintain regular health, dental, and eye exams.  Eat a healthy diet. Foods like vegetables, fruits, whole grains, low-fat dairy products, and lean protein foods contain the nutrients you need and are low in calories. Decrease your intake of foods high in solid fats, added sugars, and salt. Get information about a proper diet from your health care provider, if necessary.  Regular physical exercise is one of the most important things you can do for your health. Most adults should get at least 150 minutes of moderate-intensity exercise (any activity that increases your heart rate and causes you to sweat) each week. In addition, most adults need muscle-strengthening exercises on 2 or more days a week.   Maintain a healthy weight. The body mass index (BMI) is a screening tool to identify possible weight problems. It provides an estimate of body fat based on height and weight. Your health care provider can find your BMI and can help you achieve or maintain a healthy weight. For males 20 years and older:  A BMI below 18.5 is considered underweight.  A BMI of 18.5 to 24.9 is normal.  A BMI of 25 to 29.9 is considered overweight.  A  BMI of 30 and above is considered obese.  Maintain normal blood lipids and cholesterol by exercising and minimizing your intake of saturated fat. Eat a balanced diet with plenty of fruits and vegetables. Blood tests for lipids and cholesterol should begin at age 25 and be repeated every 5 years. If your lipid or cholesterol levels are high, you are over age 90, or you are at high risk for heart disease, you may need your cholesterol levels checked more frequently.Ongoing high lipid and cholesterol levels should be treated with medicines if diet and exercise are not working.  If you smoke, find out from your health care provider how to quit. If you do not use tobacco, do not start.  Lung cancer screening is recommended for adults aged 2-80 years who are at high risk for developing lung cancer because of a history of smoking. A yearly low-dose CT scan of the lungs is recommended for people who have at least a 30-pack-year history of smoking and are current smokers or have quit within the past 15 years. A pack year of smoking is smoking an average of 1 pack of cigarettes a day for 1 year (for example, a 30-pack-year history of smoking could mean smoking 1 pack a day for 30 years or 2 packs a day for 15 years). Yearly screening should continue until the smoker has stopped smoking for at least 15 years. Yearly screening should be stopped for people who develop a health problem that would prevent them from having lung cancer treatment.  If you choose to drink alcohol, do not have more than  2 drinks per day. One drink is considered to be 12 oz (360 mL) of beer, 5 oz (150 mL) of wine, or 1.5 oz (45 mL) of liquor.  Avoid the use of street drugs. Do not share needles with anyone. Ask for help if you need support or instructions about stopping the use of drugs.  High blood pressure causes heart disease and increases the risk of stroke. High blood pressure is more likely to develop in:  People who have blood  pressure in the end of the normal range (100-139/85-89 mm Hg).  People who are overweight or obese.  People who are African American.  If you are 30-16 years of age, have your blood pressure checked every 3-5 years. If you are 40 years of age or older, have your blood pressure checked every year. You should have your blood pressure measured twice-once when you are at a hospital or clinic, and once when you are not at a hospital or clinic. Record the average of the two measurements. To check your blood pressure when you are not at a hospital or clinic, you can use:  An automated blood pressure machine at a pharmacy.  A home blood pressure monitor.  If you are 58-69 years old, ask your health care provider if you should take aspirin to prevent heart disease.  Diabetes screening involves taking a blood sample to check your fasting blood sugar level. This should be done once every 3 years after age 51 if you are at a normal weight and without risk factors for diabetes. Testing should be considered at a younger age or be carried out more frequently if you are overweight and have at least 1 risk factor for diabetes.  Colorectal cancer can be detected and often prevented. Most routine colorectal cancer screening begins at the age of 66 and continues through age 58. However, your health care provider may recommend screening at an earlier age if you have risk factors for colon cancer. On a yearly basis, your health care provider may provide home test kits to check for hidden blood in the stool. A small camera at the end of a tube may be used to directly examine the colon (sigmoidoscopy or colonoscopy) to detect the earliest forms of colorectal cancer. Talk to your health care provider about this at age 27 when routine screening begins. A direct exam of the colon should be repeated every 5-10 years through age 81, unless early forms of precancerous polyps or small growths are found.  People who are at an  increased risk for hepatitis B should be screened for this virus. You are considered at high risk for hepatitis B if:  You were born in a country where hepatitis B occurs often. Talk with your health care provider about which countries are considered high risk.  Your parents were born in a high-risk country and you have not received a shot to protect against hepatitis B (hepatitis B vaccine).  You have HIV or AIDS.  You use needles to inject street drugs.  You live with, or have sex with, someone who has hepatitis B.  You are a man who has sex with other men (MSM).  You get hemodialysis treatment.  You take certain medicines for conditions like cancer, organ transplantation, and autoimmune conditions.  Hepatitis C blood testing is recommended for all people born from [birth year] through 1965 and any individual with known risk factors for hepatitis C.  Healthy men should no longer receive prostate-specific antigen (PSA) blood  tests as part of routine cancer screening. Talk to your health care provider about prostate cancer screening.  Testicular cancer screening is not recommended for adolescents or adult males who have no symptoms. Screening includes self-exam, a health care provider exam, and other screening tests. Consult with your health care provider about any symptoms you have or any concerns you have about testicular cancer.  Practice safe sex. Use condoms and avoid high-risk sexual practices to reduce the spread of sexually transmitted infections (STIs).  You should be screened for STIs, including gonorrhea and chlamydia if:  You are sexually active and are younger than 24 years.  You are older than 24 years, and your health care provider tells you that you are at risk for this type of infection.  Your sexual activity has changed since you were last screened, and you are at an increased risk for chlamydia or gonorrhea. Ask your health care provider if you are at risk.  If you are at  risk of being infected with HIV, it is recommended that you take a prescription medicine daily to prevent HIV infection. This is called pre-exposure prophylaxis (PrEP). You are considered at risk if:  You are a man who has sex with other men (MSM).  You are a heterosexual man who is sexually active with multiple partners.  You take drugs by injection.  You are sexually active with a partner who has HIV.  Talk with your health care provider about whether you are at high risk of being infected with HIV. If you choose to begin PrEP, you should first be tested for HIV. You should then be tested every 3 months for as long as you are taking PrEP.  Use sunscreen. Apply sunscreen liberally and repeatedly throughout the day. You should seek shade when your shadow is shorter than you. Protect yourself by wearing long sleeves, pants, a wide-brimmed hat, and sunglasses year round whenever you are outdoors.  Tell your health care provider of new moles or changes in moles, especially if there is a change in shape or color. Also, tell your health care provider if a mole is larger than the size of a pencil eraser.  A one-time screening for abdominal aortic aneurysm (AAA) and surgical repair of large AAAs by ultrasound is recommended for men aged 66-75 years who are current or former smokers.  Stay current with your vaccines (immunizations). This information is not intended to replace advice given to you by your health care provider. Make sure you discuss any questions you have with your health care provider. Document Released: 03/21/2008 Document Revised: 10/14/2014 Document Reviewed: 06/27/2015 Elsevier Interactive Patient Education  2017 [employer].

## 2016-10-29 NOTE — Assessment & Plan Note (Signed)
Has been in and out of diabetic range Diet controlled now Will decrease sugars/carbs, work on weight loss and start exercising Recheck a1c in 6 months

## 2016-10-29 NOTE — Progress Notes (Signed)
Subjective:    Patient ID: Bryan Abbott, male    DOB: 08/14/1951, 66 y.o.   MRN: [id number]  HPI  He is here to establish with a new pcp.   He is here for a physical exam.   He has not been eating healthy, exercising and has gained weight over the past several months.   He runs a 10 K in [place] every year and usually starts running in March every year.  He will lose weight over the summer.   He denies changes in his health and has no concerns.    Medications and allergies reviewed with patient and updated if appropriate.  Patient Active Problem List   Diagnosis Date Noted  . Diabetes ([place]) 10/29/2016  . Rosacea 10/29/2016  . Skin cancer 10/29/2016  . Peyronie's syndrome 08/16/2014  . 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 Dissection of carotid artery ([place]) 01/29/2010  . Thrombocytopenia ([place]) 01/24/2009  . Hyperlipidemia 05/28/2007  . Hx of gout 05/28/2007    Current Outpatient Prescriptions on File Prior to Visit  Medication Sig Dispense Refill  . ONETOUCH DELICA LANCETS 99991111 MISC USE TO TEST BLOOD SUGAR DAILY ICD R73.01 100 each 3   No current facility-administered medications on file prior to visit.     Past Medical History:  Diagnosis Date  . Carotid artery dissection ([place]) 2011  . Cataract    right and removed  . Gout   . Hyperlipidemia   . Other abnormal glucose 06/04/12   A1c 6,.7%   . Stroke [facility]) January 23, 2010   Carotid artery dissection    Past Surgical History:  Procedure Laterality Date  . bone spur and hammer toe surgery  07/2015  . CATARACT EXTRACTION     OD  . COLONOSCOPY  2006   [facility] ; negative. No FH of colon polyps or cancer  . EYE SURGERY Right 2012   Cataract  . TONSILLECTOMY AND ADENOIDECTOMY     Age 59 or 66 years old  . VASECTOMY  53-1985    Social History   Social History  . Marital status: Married    Spouse name: N/A  . Number of children: N/A  . Years of education: N/A   Social History Main Topics  . Smoking  status: Never Smoker  . Smokeless tobacco: Never Used  . Alcohol use 1.8 oz/week    3 Glasses of wine per week  . Drug use: No  . Sexual activity: Not on file   Other Topics Concern  . Not on file   Social History Narrative  . No narrative on file    Family History  Problem Relation Age of Onset  . Diabetes Mother   . Diabetes Father   . Aneurysm Father     cervical region  . Hypertension Father   . Cancer Neg Hx   . Heart disease Neg Hx   . Hyperlipidemia Neg Hx   . Colon cancer Neg Hx   . Colon polyps Neg Hx   . Esophageal cancer Neg Hx   . Rectal cancer Neg Hx   . Stomach cancer Neg Hx     Review of Systems  Constitutional: Negative for chills and fever.  Eyes: Negative for visual disturbance.  Respiratory: Negative for cough, shortness of breath and wheezing.   Cardiovascular: Negative for chest pain, palpitations and leg swelling.  Gastrointestinal: Negative for abdominal pain, blood in stool, constipation, diarrhea and nausea.       No gerd  Genitourinary: Negative for difficulty urinating, dysuria and hematuria.  Musculoskeletal: Negative for arthralgias and back pain.  Skin: Negative for rash.  Neurological: Negative for dizziness, light-headedness and headaches.  Psychiatric/Behavioral: Negative for dysphoric mood. The patient is not nervous/anxious.        Objective:   Vitals:   10/29/16 1045  BP: 114/80  Pulse: 68  Resp: 16  Temp: 98.1 F (36.7 C)   Filed Weights   10/29/16 1045  Weight: 202 lb (91.6 kg)   Body mass index is 27.4 kg/m.  Wt Readings from Last 3 Encounters:  10/29/16 202 lb (91.6 kg)  11/20/15 205 lb (93 kg)  08/17/15 190 lb (86.2 kg)     Physical Exam Constitutional: He appears well-developed and well-nourished. No distress.  HENT:  Head: Normocephalic and atraumatic.  Right Ear: External ear normal.  Left Ear: External ear normal.  Mouth/Throat: Oropharynx is clear and moist.  Normal ear canals and TM b/l  Eyes:  Conjunctivae and EOM are normal.  Neck: Neck supple. No tracheal deviation present. No thyromegaly present.  No carotid bruit  Cardiovascular: Normal rate, regular rhythm, normal heart sounds and intact distal pulses.   No murmur heard. Pulmonary/Chest: Effort normal and breath sounds normal. No respiratory distress. He has no wheezes. He has no rales.  Abdominal: Soft. Bowel sounds are normal. He exhibits no distension. There is no tenderness.  Genitourinary: deferred  Musculoskeletal: He exhibits no edema.  Lymphadenopathy:   He has no cervical adenopathy.  Skin: Skin is warm and dry. He is not diaphoretic.  Psychiatric: He has a normal mood and affect. His behavior is normal.         Assessment & Plan:   Physical exam: Screening blood work   reviewed Immunizations prevnar given today Colonoscopy   Up to date  Eye exams  Up to date  EKG - last EKG 2013 Exercise -none now - advised to restart now and to not stop exercise during winter ideally Weight -  Will work on weight loss Skin  - sees derm  Substance abuse  none  See Problem List for Assessment and Plan of chronic medical problems.  FU in 6 months

## 2016-10-29 NOTE — Assessment & Plan Note (Signed)
Lipid panel elevated Has not been compliant with statin We will restart statin Start Exercise Work on weight loss Follow-up in 6 months

## 2016-10-29 NOTE — Assessment & Plan Note (Signed)
Has an [place] of carotid arteries once a year - will have one later this year after going on medicare

## 2016-10-29 NOTE — Assessment & Plan Note (Signed)
Chronic, stable We'll continue to monitor

## 2016-10-29 NOTE — Assessment & Plan Note (Signed)
On left arm Following with dermatology

## 2016-10-29 NOTE — Assessment & Plan Note (Addendum)
Not taking allopurinol daily No gout flares Restart allopurinol  check uric acid level

## 2016-10-29 NOTE — Progress Notes (Signed)
Pre visit review using our clinic review tool, if applicable. No additional management support is needed unless otherwise documented below in the visit note. 

## 2016-10-30 LAB — PSA, TOTAL AND FREE
PROSTATE SPECIFIC AG, SERUM: 2.3 ng/mL (ref 0.0–4.0)
PSA FREE: 0.56 ng/mL
PSA, Free Pct: 24.3 %

## 2016-10-31 ENCOUNTER — Encounter: Payer: Self-pay | Admitting: Internal Medicine

## 2016-11-01 ENCOUNTER — Encounter: Payer: Self-pay | Admitting: Internal Medicine

## 2016-11-19 ENCOUNTER — Encounter: Payer: Self-pay | Admitting: Family

## 2016-11-27 ENCOUNTER — Encounter (HOSPITAL_COMMUNITY): Payer: BLUE CROSS/BLUE SHIELD

## 2016-11-27 ENCOUNTER — Ambulatory Visit: Payer: BLUE CROSS/BLUE SHIELD | Admitting: Family

## 2017-02-10 ENCOUNTER — Ambulatory Visit (HOSPITAL_COMMUNITY): Payer: BLUE CROSS/BLUE SHIELD

## 2017-02-10 ENCOUNTER — Ambulatory Visit: Payer: BLUE CROSS/BLUE SHIELD | Admitting: Family

## 2017-04-28 ENCOUNTER — Ambulatory Visit: Payer: BLUE CROSS/BLUE SHIELD | Admitting: Internal Medicine

## 2018-05-05 LAB — HEMOGLOBIN A1C: Hemoglobin A1C, External: 7.5 % — ABNORMAL HIGH (ref 3.2–7.0)

## 2018-08-11 LAB — HEMOGLOBIN A1C: Hemoglobin A1C, External: 6.1 % (ref 3.2–7.0)

## 2018-11-06 LAB — HEMOGLOBIN A1C: Hemoglobin A1C, External: 6.3 % (ref 3.2–7.0)

## 2019-05-18 LAB — HEMOGLOBIN A1C: Hemoglobin A1C, External: 6.6 % (ref 3.2–7.0)

## 2019-12-07 LAB — HEMOGLOBIN A1C
Hemoglobin A1C, External: 5.6 %
Hemoglobin A1C, External: 6.6 % (ref 3.2–7.0)
Hemoglobin A1c, External: 5.6 %

## 2020-02-11 ENCOUNTER — Ambulatory Visit: Attending: Urology | Primary: Internal Medicine

## 2020-02-11 ENCOUNTER — Ambulatory Visit: Admit: 2020-02-11 | Discharge: 2020-02-11 | Payer: MEDICARE | Attending: Urology | Primary: Internal Medicine

## 2020-02-11 DIAGNOSIS — R972 Elevated prostate specific antigen [PSA]: Secondary | ICD-10-CM

## 2020-02-11 LAB — AMB POC URINALYSIS DIP STICK AUTO W/O MICRO (PGU)
Bilirubin (UA POC): NEGATIVE
Bilirubin, Urine, POC: NEGATIVE
Blood (UA POC): NEGATIVE
Blood (UA POC): NEGATIVE
Glucose (UA POC): NEGATIVE mg/dL
Glucose, Urine, POC: NEGATIVE mg/dL
KETONES, Urine, POC: 15
Ketones (UA POC): 15
Leukocyte Esterase, Urine, POC: NEGATIVE
Leukocyte esterase (UA POC): NEGATIVE
Nitrite, Urine, POC: NEGATIVE
Nitrites (UA POC): NEGATIVE
Protein (UA POC): NEGATIVE
Protein, Urine, POC: NEGATIVE
Specific Gravity, Urine, POC: 1.025 NA (ref 1.001–1.035)
Specific gravity (UA POC): 1.025 (ref 1.001–1.035)
Urobilinogen (POC): 0.2
Urobilinogen, POC: 0.2
pH (UA POC): 5.5 (ref 4.6–8.0)
pH, Urine, POC: 5.5 NA (ref 4.6–8.0)

## 2020-02-11 NOTE — Progress Notes (Signed)
[facility]  [address]  [address]  [place], [place] [address]  [phone]    Calvin Hughes  DOB: May 07, 1951    CC: PSA Screening     HPI     Calvin Hughes is a 69 y.o. Casucasian male who is referred to clinic for PSA screening and Peyronie's Disease.    He reports no FH of CAP and has had normal PSAs with his PCP but no DRE since 2016.  He wants to establish care with urology for CAP screening.     Denies urgency, frequency, retention, urinary incontinence, hematuria or other urinary symptoms.     He does also have PD and reports upward 20 degree curvature.  He can achieve erections and have satisfactory intercourse but states that sensation is less than it used to be.  He does not need ED meds.  No history of trauma or known cause for PD but states it has been present X 5-6 years.       He is not on any meds for bladder/prostate.  PSA 1.8 in 12/2019 with PCP    Past Medical History:   Diagnosis Date   ??? Diabetes (HCC)      No past surgical history on file.    Not on File  Social History     Socioeconomic History   ??? Marital status: MARRIED     Spouse name: Not on file   ??? Number of children: Not on file   ??? Years of education: Not on file   ??? Highest education level: Not on file   Occupational History   ??? Not on file   Social Needs   ??? Financial resource strain: Not on file   ??? Food insecurity     Worry: Not on file     Inability: Not on file   ??? Transportation needs     Medical: Not on file     Non-medical: Not on file   Tobacco Use   ??? Smoking status: Never Smoker   Substance and Sexual Activity   ??? Alcohol use: Yes   ??? Drug use: Not on file   ??? Sexual activity: Not on file   Lifestyle   ??? Physical activity     Days per week: Not on file     Minutes per session: Not on file   ??? Stress: Not on file   Relationships   ??? Social Wellsite geologist on phone: Not on file     Gets together: Not on file     Attends religious service: Not on file     Active member of club or organization: Not on  file     Attends meetings of clubs or organizations: Not on file     Relationship status: Not on file   ??? Intimate partner violence     Fear of current or ex partner: Not on file     Emotionally abused: Not on file     Physically abused: Not on file     Forced sexual activity: Not on file   Other Topics Concern   ??? Not on file   Social History Narrative   ??? Not on file     Family History   Problem Relation Age of Onset   ??? Diabetes Mother    ??? Diabetes Father        Review of Systems  Constitutional:   Negative for fever, chills, appetite change, malaise/fatigue, headaches and weight  loss.  Skin: Positive for rash and itching. Negative for skin lesions.  Eyes:  Negative for visual disturbance, eye pain and eye discharge.  ENT:  Negative for difficulty articulating words, pain swallowing, high frequency hearing loss and dry mouth.  Respiratory:  Negative for cough, blood in sputum, shortness of breath and wheezing.  Cardiovascular:  Negative for chest pain, hypertension, irregular heartbeat, leg pain, leg swelling, regular rate and rhythm and varicose veins.  GI:  Negative for nausea, vomiting, abdominal pain, blood in stool, constipation, diarrhea, indigestion and heartburn.  Genitourinary: Positive for urinary burning. Negative for hematuria, flank pain, recurrent UTIs, history of urolithiasis, nocturia, slower stream, straining, urgency, leakage w/ urge, frequent urination, incomplete emptying, erectile dysfunction, testicular pain, sexually transmitted disease, discharge and urethral stricture.  Musculoskeletal: Positive for arthralgias. Negative for back pain, bone pain, tenderness, muscle weakness and neck pain.  Neurological:  Negative for dizziness, focal weakness, numbness, seizures and tremors.  Psychological:  Negative for depression and psychiatric problem.  Endocrine:  Negative for cold intolerance, thirst, excessive urination, fatigue and heat intolerance.  Hem/Lymphatic:  Negative for easy bleeding,  easy bruising and frequent infections.      Urinalysis  UA - Dipstick  Results for orders placed or performed in visit on 02/11/20   AMB POC URINALYSIS DIP STICK AUTO W/O MICRO (PGU)     Status: None   Result Value Ref Range Status    Glucose (UA POC) Negative Negative mg/dL Final    Bilirubin (UA POC) Negative Negative Final    Ketones (UA POC) 15  Negative Final    Specific gravity (UA POC) 1.025 1.001 - 1.035 Final    Blood (UA POC) Negative Negative Final    pH (UA POC) 5.5 4.6 - 8.0 Final    Protein (UA POC) Negative Negative Final    Urobilinogen (POC) 0.2 mg/dL  Final    Nitrites (UA POC) Negative Negative Final    Leukocyte esterase (UA POC) Negative Negative Final       There were no vitals taken for this visit.     GENERAL: No acute distress, Awake, Alert, Oriented X 3, Gait normal  CARDIAC: regular rate and rhythm  CHEST AND LUNG: Easy work of breathing, clear to auscultation bilaterally, no cyanosis  ABDOMEN: soft, non tender, non-distended, positive bowel sounds, no organomegaly, no palpable masses, no guarding, no rebound tenderness  DRE: 20 gram, symmetric, smooth without nodules.   SKIN: No rash, no erythema, no lacerations or abrasions, no ecchymosis  NEUROLOGIC: cranial nerves 2-12 grossly intact           Assessment and Plan    ICD-10-CM ICD-9-CM    1. Elevated PSA  R97.20 790.93 AMB POC URINALYSIS DIP STICK AUTO W/O MICRO (PGU)   2. Screening for prostate cancer  Z12.5 V76.44    3. Peyronie's disease  N48.6 607.85      PSA Screening:     We reviewed AUA guidelines for annual PSA screening today.  He wants to return in 1 year with PSA prior for DRE check    PD:   I discussed the patho-physiology of Peyronie's with pt. today.  We then discussed treatment options including doing nothing, NSAID therapy, Xiaflex plaque injection, plication, and excision and grafting procedures.  Chances of success and risks of each were discussed.      Patient has decided to pursue no treatment at this time given that  he can engage in satisfactory intercourse with no ED.  I have spent 30 minutes today reviewing previous notes, test results and face to face with the patient as well as documenting.      Jentri Aye A. Denyse Dago, M.D.    [facility]  [facility]  [address] [place] [address]  Phone: [phone]  Fax: [phone]

## 2020-02-11 NOTE — Progress Notes (Signed)
[facility]  [address]  [address]  [place], [place] [address]  [phone]    Calvin Hughes  DOB: May 07, 1951    CC: PSA Screening     HPI     Calvin Hughes is a 69 y.o. Casucasian male who is referred to clinic for PSA screening and Peyronie's Disease.    He reports no FH of CAP and has had normal PSAs with his PCP but no DRE since 2016.  He wants to establish care with urology for CAP screening.     Denies urgency, frequency, retention, urinary incontinence, hematuria or other urinary symptoms.     He does also have PD and reports upward 20 degree curvature.  He can achieve erections and have satisfactory intercourse but states that sensation is less than it used to be.  He does not need ED meds.  No history of trauma or known cause for PD but states it has been present X 5-6 years.       He is not on any meds for bladder/prostate.  PSA 1.8 in 12/2019 with PCP    Past Medical History:   Diagnosis Date   ??? Diabetes (HCC)      No past surgical history on file.    Not on File  Social History     Socioeconomic History   ??? Marital status: MARRIED     Spouse name: Not on file   ??? Number of children: Not on file   ??? Years of education: Not on file   ??? Highest education level: Not on file   Occupational History   ??? Not on file   Social Needs   ??? Financial resource strain: Not on file   ??? Food insecurity     Worry: Not on file     Inability: Not on file   ??? Transportation needs     Medical: Not on file     Non-medical: Not on file   Tobacco Use   ??? Smoking status: Never Smoker   Substance and Sexual Activity   ??? Alcohol use: Yes   ??? Drug use: Not on file   ??? Sexual activity: Not on file   Lifestyle   ??? Physical activity     Days per week: Not on file     Minutes per session: Not on file   ??? Stress: Not on file   Relationships   ??? Social Wellsite geologist on phone: Not on file     Gets together: Not on file     Attends religious service: Not on file     Active member of club or organization: Not on  file     Attends meetings of clubs or organizations: Not on file     Relationship status: Not on file   ??? Intimate partner violence     Fear of current or ex partner: Not on file     Emotionally abused: Not on file     Physically abused: Not on file     Forced sexual activity: Not on file   Other Topics Concern   ??? Not on file   Social History Narrative   ??? Not on file     Family History   Problem Relation Age of Onset   ??? Diabetes Mother    ??? Diabetes Father        Review of Systems  Constitutional:   Negative for fever, chills, appetite change, malaise/fatigue, headaches and weight  loss.  Skin: Positive for rash and itching. Negative for skin lesions.  Eyes:  Negative for visual disturbance, eye pain and eye discharge.  ENT:  Negative for difficulty articulating words, pain swallowing, high frequency hearing loss and dry mouth.  Respiratory:  Negative for cough, blood in sputum, shortness of breath and wheezing.  Cardiovascular:  Negative for chest pain, hypertension, irregular heartbeat, leg pain, leg swelling, regular rate and rhythm and varicose veins.  GI:  Negative for nausea, vomiting, abdominal pain, blood in stool, constipation, diarrhea, indigestion and heartburn.  Genitourinary: Positive for urinary burning. Negative for hematuria, flank pain, recurrent UTIs, history of urolithiasis, nocturia, slower stream, straining, urgency, leakage w/ urge, frequent urination, incomplete emptying, erectile dysfunction, testicular pain, sexually transmitted disease, discharge and urethral stricture.  Musculoskeletal: Positive for arthralgias. Negative for back pain, bone pain, tenderness, muscle weakness and neck pain.  Neurological:  Negative for dizziness, focal weakness, numbness, seizures and tremors.  Psychological:  Negative for depression and psychiatric problem.  Endocrine:  Negative for cold intolerance, thirst, excessive urination, fatigue and heat intolerance.  Hem/Lymphatic:  Negative for easy bleeding,  easy bruising and frequent infections.      Urinalysis  UA - Dipstick  Results for orders placed or performed in visit on 02/11/20   AMB POC URINALYSIS DIP STICK AUTO W/O MICRO (PGU)     Status: None   Result Value Ref Range Status    Glucose (UA POC) Negative Negative mg/dL Final    Bilirubin (UA POC) Negative Negative Final    Ketones (UA POC) 15  Negative Final    Specific gravity (UA POC) 1.025 1.001 - 1.035 Final    Blood (UA POC) Negative Negative Final    pH (UA POC) 5.5 4.6 - 8.0 Final    Protein (UA POC) Negative Negative Final    Urobilinogen (POC) 0.2 mg/dL  Final    Nitrites (UA POC) Negative Negative Final    Leukocyte esterase (UA POC) Negative Negative Final       There were no vitals taken for this visit.     GENERAL: No acute distress, Awake, Alert, Oriented X 3, Gait normal  CARDIAC: regular rate and rhythm  CHEST AND LUNG: Easy work of breathing, clear to auscultation bilaterally, no cyanosis  ABDOMEN: soft, non tender, non-distended, positive bowel sounds, no organomegaly, no palpable masses, no guarding, no rebound tenderness  DRE: 20 gram, symmetric, smooth without nodules.   SKIN: No rash, no erythema, no lacerations or abrasions, no ecchymosis  NEUROLOGIC: cranial nerves 2-12 grossly intact           Assessment and Plan    ICD-10-CM ICD-9-CM    1. Elevated PSA  R97.20 790.93 AMB POC URINALYSIS DIP STICK AUTO W/O MICRO (PGU)   2. Screening for prostate cancer  Z12.5 V76.44    3. Peyronie's disease  N48.6 607.85      PSA Screening:     We reviewed AUA guidelines for annual PSA screening today.  He wants to return in 1 year with PSA prior for DRE check    PD:   I discussed the patho-physiology of Peyronie's with pt. today.  We then discussed treatment options including doing nothing, NSAID therapy, Xiaflex plaque injection, plication, and excision and grafting procedures.  Chances of success and risks of each were discussed.      Patient has decided to pursue no treatment at this time given that  he can engage in satisfactory intercourse with no ED.      I have spent 30 minutes today reviewing previous notes, test results and face to face with the patient as well as documenting.      Jentri Aye A. Denyse Dago, M.D.    [facility]  [facility]  [address] [place] [address]  Phone: [phone]  Fax: [phone]

## 2020-07-14 ENCOUNTER — Ambulatory Visit: Attending: Adult Health | Primary: Internal Medicine

## 2020-07-14 ENCOUNTER — Ambulatory Visit: Admit: 2020-07-14 | Discharge: 2020-07-14 | Payer: MEDICARE | Attending: Adult Health | Primary: Internal Medicine

## 2020-07-14 ENCOUNTER — Inpatient Hospital Stay: Payer: MEDICARE

## 2020-07-14 ENCOUNTER — Encounter: Admit: 2020-07-14 | Payer: MEDICARE | Primary: Internal Medicine

## 2020-07-14 DIAGNOSIS — N200 Calculus of kidney: Secondary | ICD-10-CM

## 2020-07-14 LAB — URINALYSIS W/ RFLX MICROSCOPIC
Bilirubin, Urine: NEGATIVE
Bilirubin: NEGATIVE
Blood, Urine: NEGATIVE
Blood: NEGATIVE
Glucose, Ur: NEGATIVE mg/dL
Glucose: NEGATIVE mg/dL
Ketone: NEGATIVE mg/dL
Ketones, Urine: NEGATIVE mg/dL
Leukocyte Esterase, Urine: NEGATIVE
Leukocyte Esterase: NEGATIVE
Nitrite, Urine: NEGATIVE
Nitrites: NEGATIVE
Protein, UA: NEGATIVE mg/dL
Protein: NEGATIVE mg/dL
Specific Gravity, UA: 1.016 (ref 1.001–1.023)
Specific gravity: 1.016 (ref 1.001–1.023)
Urobilinogen, UA, POCT: 0.2 EU/dL (ref 0.2–1.0)
Urobilinogen: 0.2 EU/dL (ref 0.2–1.0)
pH (UA): 5.5 (ref 5.0–9.0)
pH, UA: 5.5 (ref 5.0–9.0)

## 2020-07-14 LAB — CBC
Hematocrit: 42.1 % (ref 41.1–50.3)
Hemoglobin: 13.2 g/dL — ABNORMAL LOW (ref 13.6–17.2)
MCH: 27.7 PG (ref 26.1–32.9)
MCHC: 31.4 g/dL (ref 31.4–35.0)
MCV: 88.4 FL (ref 79.6–97.8)
MPV: 13.4 FL — ABNORMAL HIGH (ref 9.4–12.3)
NRBC Absolute: 0 10*3/uL (ref 0.0–0.2)
Platelets: 113 10*3/uL — ABNORMAL LOW (ref 150–450)
RBC: 4.76 M/uL (ref 4.23–5.6)
RDW: 13.5 % (ref 11.9–14.6)
WBC: 6.2 10*3/uL (ref 4.3–11.1)

## 2020-07-14 LAB — BASIC METABOLIC PANEL
Anion Gap: 7 mmol/L (ref 7–16)
BUN: 24 MG/DL — ABNORMAL HIGH (ref 8–23)
CO2: 24 mmol/L (ref 21–32)
Calcium: 9.2 MG/DL (ref 8.3–10.4)
Chloride: 109 mmol/L — ABNORMAL HIGH (ref 98–107)
Creatinine: 1.83 MG/DL — ABNORMAL HIGH (ref 0.8–1.5)
EGFR IF NonAfrican American: 39 mL/min/{1.73_m2} — ABNORMAL LOW (ref >60)
GFR African American: 48 mL/min/{1.73_m2} — ABNORMAL LOW (ref >60)
Glucose: 93 mg/dL (ref 65–100)
Potassium: 4.9 mmol/L (ref 3.5–5.1)
Sodium: 140 mmol/L (ref 136–145)

## 2020-07-14 LAB — COVID-19, RAPID: SARS-CoV-2, Rapid: NOT DETECTED

## 2020-07-14 LAB — AMB POC URINALYSIS DIP STICK AUTO W/O MICRO (PGU)
Bilirubin (UA POC): NEGATIVE
Bilirubin, Urine, POC: NEGATIVE
Blood (UA POC): NEGATIVE
Blood (UA POC): NEGATIVE
Glucose (UA POC): NEGATIVE mg/dL
Glucose, Urine, POC: NEGATIVE mg/dL
KETONES, Urine, POC: NEGATIVE
Ketones (UA POC): NEGATIVE
Leukocyte Esterase, Urine, POC: NEGATIVE
Leukocyte esterase (UA POC): NEGATIVE
Nitrite, Urine, POC: NEGATIVE
Nitrites (UA POC): NEGATIVE
Protein (UA POC): NEGATIVE
Protein, Urine, POC: NEGATIVE
Specific Gravity, Urine, POC: 1.025 NA (ref 1.001–1.035)
Specific gravity (UA POC): 1.025 (ref 1.001–1.035)
Urobilinogen (POC): 0.2
Urobilinogen, POC: 0.2
pH (UA POC): 5.5 (ref 4.6–8.0)
pH, Urine, POC: 5.5 NA (ref 4.6–8.0)

## 2020-07-14 LAB — POCT GLUCOSE: POC Glucose: 90 mg/dL (ref 65–100)

## 2020-07-14 LAB — GLUCOSE, POC: Glucose (POC): 90 mg/dL (ref 65–100)

## 2020-07-14 LAB — METABOLIC PANEL, BASIC
Anion gap: 7 mmol/L (ref 7–16)
BUN: 24 MG/DL — ABNORMAL HIGH (ref 8–23)
CO2: 24 mmol/L (ref 21–32)
Calcium: 9.2 MG/DL (ref 8.3–10.4)
Chloride: 109 mmol/L — ABNORMAL HIGH (ref 98–107)
Creatinine: 1.83 MG/DL — ABNORMAL HIGH (ref 0.8–1.5)
GFR est AA: 48 mL/min/{1.73_m2} — ABNORMAL LOW (ref >60)
GFR est non-AA: 39 mL/min/{1.73_m2} — ABNORMAL LOW (ref >60)
Glucose: 93 mg/dL (ref 65–100)
Potassium: 4.9 mmol/L (ref 3.5–5.1)
Sodium: 140 mmol/L (ref 136–145)

## 2020-07-14 LAB — CBC W/O DIFF
ABSOLUTE NRBC: 0 10*3/uL (ref 0.0–0.2)
HCT: 42.1 % (ref 41.1–50.3)
HGB: 13.2 g/dL — ABNORMAL LOW (ref 13.6–17.2)
MCH: 27.7 PG (ref 26.1–32.9)
MCHC: 31.4 g/dL (ref 31.4–35.0)
MCV: 88.4 FL (ref 79.6–97.8)
MPV: 13.4 FL — ABNORMAL HIGH (ref 9.4–12.3)
PLATELET: 113 10*3/uL — ABNORMAL LOW (ref 150–450)
RBC: 4.76 M/uL (ref 4.23–5.6)
RDW: 13.5 % (ref 11.9–14.6)
WBC: 6.2 10*3/uL (ref 4.3–11.1)

## 2020-07-14 LAB — COVID-19 RAPID TEST: COVID-19 rapid test: NOT DETECTED

## 2020-07-14 MED ORDER — NALOXONE 0.4 MG/ML INJECTION
0.4 mg/mL | INTRAMUSCULAR | Status: DC | PRN
Start: 2020-07-14 — End: 2020-07-14

## 2020-07-14 MED ORDER — PROPOFOL 10 MG/ML IV EMUL
10 mg/mL | INTRAVENOUS | Status: DC | PRN
Start: 2020-07-14 — End: 2020-07-14
  Administered 2020-07-14: 21:00:00 via INTRAVENOUS

## 2020-07-14 MED ORDER — LIDOCAINE HCL 1 % (10 MG/ML) IJ SOLN
10 mg/mL (1 %) | INTRAMUSCULAR | Status: DC | PRN
Start: 2020-07-14 — End: 2020-07-14

## 2020-07-14 MED ORDER — MIDAZOLAM 1 MG/ML IJ SOLN
1 mg/mL | Freq: Once | INTRAMUSCULAR | Status: DC
Start: 2020-07-14 — End: 2020-07-14

## 2020-07-14 MED ORDER — ONDANSETRON (PF) 4 MG/2 ML INJECTION
4 mg/2 mL | Freq: Once | INTRAMUSCULAR | Status: DC | PRN
Start: 2020-07-14 — End: 2020-07-14

## 2020-07-14 MED ORDER — LIDOCAINE (PF) 20 MG/ML (2 %) IJ SOLN
20 mg/mL (2 %) | INTRAMUSCULAR | Status: DC | PRN
Start: 2020-07-14 — End: 2020-07-14
  Administered 2020-07-14: 21:00:00 via INTRAVENOUS

## 2020-07-14 MED ORDER — OXYCODONE 5 MG TAB
5 mg | Freq: Once | ORAL | Status: DC | PRN
Start: 2020-07-14 — End: 2020-07-14

## 2020-07-14 MED ORDER — NEOSTIGMINE METHYLSULFATE 3 MG/3 ML (1 MG/ML) IV SYRINGE
3 mg/ mL (1 mg/mL) | INTRAVENOUS | Status: DC | PRN
Start: 2020-07-14 — End: 2020-07-14
  Administered 2020-07-14: 21:00:00 via INTRAVENOUS

## 2020-07-14 MED ORDER — LACTATED RINGERS IV
INTRAVENOUS | Status: DC
Start: 2020-07-14 — End: 2020-07-14
  Administered 2020-07-14: 19:00:00 via INTRAVENOUS

## 2020-07-14 MED ORDER — ONDANSETRON (PF) 4 MG/2 ML INJECTION
4 mg/2 mL | INTRAMUSCULAR | Status: DC | PRN
Start: 2020-07-14 — End: 2020-07-14
  Administered 2020-07-14: 21:00:00 via INTRAVENOUS

## 2020-07-14 MED ORDER — ROCURONIUM 10 MG/ML IV
10 mg/mL | INTRAVENOUS | Status: DC | PRN
Start: 2020-07-14 — End: 2020-07-14
  Administered 2020-07-14 (×2): via INTRAVENOUS

## 2020-07-14 MED ORDER — CEFAZOLIN 2 GRAM/20 ML IN STERILE WATER INTRAVENOUS SYRINGE
2 gram/0 mL | Freq: Once | INTRAVENOUS | Status: AC
Start: 2020-07-14 — End: 2020-07-14
  Administered 2020-07-14: 21:00:00 via INTRAVENOUS

## 2020-07-14 MED ORDER — HALOPERIDOL LACTATE 5 MG/ML IJ SOLN
5 mg/mL | Freq: Once | INTRAMUSCULAR | Status: DC | PRN
Start: 2020-07-14 — End: 2020-07-14

## 2020-07-14 MED ORDER — DEXAMETHASONE SODIUM PHOSPHATE 4 MG/ML IJ SOLN
4 mg/mL | INTRAMUSCULAR | Status: DC | PRN
Start: 2020-07-14 — End: 2020-07-14
  Administered 2020-07-14: 21:00:00 via INTRAVENOUS

## 2020-07-14 MED ORDER — MIDAZOLAM 1 MG/ML IJ SOLN
1 mg/mL | Freq: Once | INTRAMUSCULAR | Status: DC | PRN
Start: 2020-07-14 — End: 2020-07-14

## 2020-07-14 MED ORDER — LACTATED RINGERS IV
INTRAVENOUS | Status: DC
Start: 2020-07-14 — End: 2020-07-14

## 2020-07-14 MED ORDER — SUCCINYLCHOLINE CHLORIDE 20 MG/ML INJECTION
20 mg/mL | INTRAMUSCULAR | Status: DC | PRN
Start: 2020-07-14 — End: 2020-07-14
  Administered 2020-07-14: 21:00:00 via INTRAVENOUS

## 2020-07-14 MED ORDER — ACETAMINOPHEN 500 MG TAB
500 mg | Freq: Once | ORAL | Status: AC
Start: 2020-07-14 — End: 2020-07-14
  Administered 2020-07-14: 19:00:00 via ORAL

## 2020-07-14 MED ORDER — GLYCOPYRROLATE 0.2 MG/ML IJ SOLN
0.2 mg/mL | INTRAMUSCULAR | Status: DC | PRN
Start: 2020-07-14 — End: 2020-07-14
  Administered 2020-07-14: 21:00:00 via INTRAVENOUS

## 2020-07-14 MED ORDER — FENTANYL CITRATE (PF) 50 MCG/ML IJ SOLN
50 mcg/mL | Freq: Once | INTRAMUSCULAR | Status: DC
Start: 2020-07-14 — End: 2020-07-14

## 2020-07-14 MED ORDER — HYDROMORPHONE 2 MG/ML INJECTION SOLUTION
2 mg/mL | INTRAMUSCULAR | Status: DC | PRN
Start: 2020-07-14 — End: 2020-07-14

## 2020-07-14 MED FILL — CEFAZOLIN 2 GRAM/20 ML IN STERILE WATER INTRAVENOUS SYRINGE: 2 gram/0 mL | INTRAVENOUS | Qty: 20

## 2020-07-14 MED FILL — TYLENOL EXTRA STRENGTH 500 MG TABLET: 500 mg | ORAL | Qty: 2

## 2020-07-14 NOTE — Telephone Encounter (Signed)
Added on at 4, spoke to the patient

## 2020-07-14 NOTE — Anesthesia Pre-Procedure Evaluation (Signed)
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       Cardiovascular              PAD (Occluded L carotid artery)    Exercise tolerance: >4 METS  Comments: Denies CP, SOB or changes in functional status   GI/Hepatic/Renal         Renal disease: stones       Endo/Other    Diabetes: well controlled, type 2         Other Findings            Physical Exam    Airway  Mallampati: III  TM Distance: 4 - 6 cm  Neck ROM: normal range of motion   Mouth opening: Normal     Cardiovascular    Rhythm: regular  Rate: normal         Dental    Dentition: Caps/crowns and Implants     Pulmonary  Breath sounds clear to auscultation               Abdominal  GI exam deferred       Other Findings            Anesthetic Plan    ASA: 2  Anesthesia type: general          Induction: Intravenous  Anesthetic plan and risks discussed with: Patient      Plan for GETA. Hold surgery until 1530

## 2020-07-14 NOTE — Op Note (Signed)
Brief Postoperative Note    Patient: Calvin Hughes  Date of Birth: 07/12/1951  MRN: [id number]    Date of Procedure: 07/14/2020     Pre-Op Diagnosis: L proximal ureteral stone [N20.1]    Post-Op Diagnosis: Same    Procedure(s):  Left LITHOTRIPSY EXTRACORPOREAL SHOCKWAVE ESWL    Surgeon(s):  Syd Manges, Lyndle Herrlich, DO    Surgical Assistant: None    Anesthesia: General     Estimated Blood Loss (mL): Nil    Complications: none immediate    Specimens: * No specimens in log *     Implants: * No implants in log *    Drains: * No LDAs found *    Findings: see op note    Electronically Signed by Pryor Montes, DO on 07/14/2020 at 4:59 PM

## 2020-07-14 NOTE — Op Note (Signed)
[facility]    Name:  SHRIYAN, ARAKAWA  MR#:  [id number]  DOB:  10-17-1950  ACCOUNT #:  [id number]  DATE OF SERVICE:  07/14/2020    PREOPERATIVE DIAGNOSIS:  Left proximal ureteral stone.    POSTOPERATIVE DIAGNOSIS:  Left proximal ureteral stone.    PROCEDURE PERFORMED:  Left extracorporeal shockwave lithotripsy.    SURGEON:  Lyndle Herrlich Finnegan Gatta, DO    ASSISTANT:  None.    ANESTHESIA:  General.    COMPLICATIONS:  None immediate.    SPECIMENS REMOVED:  None    IMPLANTS:  None.    ESTIMATED BLOOD LOSS:  None.    CLINICAL HISTORY:  This is a 69 year old gentleman recently diagnosed with a 6-mm left proximal ureteral stone by CT after presenting with left flank pain.  All treatment options have been reviewed.  All risks, benefits and alternatives to discussion have been discussed and he is willing to proceed at this time.    DESCRIPTION OF PROCEDURE:  Patient consent was obtained.  The patient was brought back to the operating room at which time he was placed in the supine position.  The stone was visualized under fluoroscopy.  After the uneventful induction of general anesthesia, the stone was then localized under the X, Y and Z axes.  A total of 3000 shocks were administered to the stone.  The initial 400 shocks were administered at a rate of 60 shocks per minute.  The subsequent 2600 shocks were administered at a rate of 90 shocks per minute.  The stone did appear to fragment nicely throughout the case.  The patient tolerated the procedure well.  He will follow up with the nurse practitioner in 2 weeks for a postoperative KUB.        Terrilyn Tyner P Bernard Donahoo, DO      [id number]  D:  07/14/2020 17:12  T:  07/14/2020 19:15  JOB #:  [id number]

## 2020-07-14 NOTE — Anesthesia Post-Procedure Evaluation (Signed)
Procedure(s):  Left LITHOTRIPSY EXTRACORPOREAL SHOCKWAVE ESWL.    general    Anesthesia Post Evaluation      Multimodal analgesia: multimodal analgesia used between 6 hours prior to anesthesia start to PACU discharge  Patient location during evaluation: bedside  Patient participation: complete - patient participated  Level of consciousness: awake and alert  Pain score: 1  Pain management: adequate  Airway patency: patent  Anesthetic complications: no  Cardiovascular status: acceptable  Respiratory status: acceptable  Hydration status: acceptable  Comments: Pt doing well. Ok to d/c home.   Post anesthesia nausea and vomiting:  none  Final Post Anesthesia Temperature Assessment:  Normothermia (36.0-37.5 degrees C)      INITIAL Post-op Vital signs:   Vitals Value Taken Time   BP 150/66 07/14/20 1756   Temp 36.9 ??C (98.4 ??F) 07/14/20 1726   Pulse 59 07/14/20 1800   Resp 12 07/14/20 1726   SpO2 98 % 07/14/20 1800   Vitals shown include unvalidated device data.

## 2020-07-14 NOTE — Telephone Encounter (Signed)
Please add patient on today with Dr. Rozann Lesches repeat for left ESWL he has a 6 mm left proximal ureteral stone.  He tells me he had 2 eggs for breakfast around 7 AM this morning.  Orders have been done.

## 2020-07-14 NOTE — Interval H&P Note (Signed)
Preoperative flank skin condition: warm, dry  Lead shield: No -   Patient ear protection: Yes   Gel applied to patient's flank and Lithotripsy head: Yes   Starting power level: 7  Shock start time (first scout fluoroscopy): 1637  Shock stop time (last lithotripsy shock): 1714  Ending power level: 11  Total shocks: 3000  Total fluoroscopy time: 2:27  Postoperative flank skin condition: warm, dry

## 2020-07-14 NOTE — Progress Notes (Signed)
[facility]  [address]  [place], [place] [address]  [phone]          Calvin Hughes  DOB: 1951/06/23    Chief Complaint   Patient presents with   ??? Kidney Stone          HPI     Calvin Hughes is a 69 y.o. male    Here today for follow-up on stone disease.  Patient reports that he was visiting his grandchildren in [place] last week when he had abrupt onset left flank pain.  Patient reported the pain was severe it was causing some nausea and some vomiting.  Nothing that he did help the pain.  They went on to the ER.  A CT scan was ordered in the ER the impression read 6 mm left proximal ureteral stone with mild hydronephrosis.  He says he is continued to use the pain medication around-the-clock.  Yesterday he had to double up and take 2 in order to help the pain.  The pain is still located within the left flank area and occasionally will radiate around to the left lower quadrant.  No fever chills or gross hematuria been mentioned.    He does have the disc with him and I was able to review that.  He also has the report with him.  A KUB in the office will be obtained.    This is his first stone ever.  He has a twin sister this only had 1 stone in her lifetime.      Past Medical History:   Diagnosis Date   ??? Diabetes (HCC)      No past surgical history on file.  Current Outpatient Medications   Medication Sig Dispense Refill   ??? HYDROcodone-acetaminophen (Norco) 5-325 mg per tablet Take  by mouth.     ??? acetaminophen (TylenoL) 325 mg tablet Take  by mouth every four (4) hours as needed for Pain.     ??? ibuprofen (Motrin IB) 200 mg tablet Take  by mouth.     ??? tamsulosin (Flomax) 0.4 mg capsule Take 0.4 mg by mouth daily.     ??? ondansetron hcl (Zofran) 4 mg tablet Take 4 mg by mouth every eight (8) hours as needed for Nausea or Vomiting.     ??? allopurinoL (ZYLOPRIM) 300 mg tablet as directed     ??? loratadine 10 mg cap Take 10 mg by mouth.     ??? betamethasone dipropionate (DIPROSONE) 0.05 % topical  cream Apply  to affected area daily as needed.     ??? atorvastatin (LIPITOR) 20 mg tablet TAKE ONE TABLET BY MOUTH EVERY NIGHT     ??? fexofenadine (ALLEGRA) 180 mg tablet Take 180 mg by mouth two (2) times a day.     ??? lancets 33 gauge misc by Not Applicable route daily.     ??? OneTouch Verio Flex meter misc ONE MISCELLANEOUS ONE TIME DAILY     ??? OneTouch Verio test strips strip TEST ONCE DAILY       Not on File  Social History     Socioeconomic History   ??? Marital status: MARRIED     Spouse name: Not on file   ??? Number of children: Not on file   ??? Years of education: Not on file   ??? Highest education level: Not on file   Occupational History   ??? Not on file   Tobacco Use   ??? Smoking status: Never Smoker  Substance and Sexual Activity   ??? Alcohol use: Yes   ??? Drug use: Not on file   ??? Sexual activity: Not on file   Other Topics Concern   ??? Not on file   Social History Narrative   ??? Not on file     Social Determinants of Health     Financial Resource Strain:    ??? Difficulty of Paying Living Expenses:    Food Insecurity:    ??? Worried About Programme researcher, broadcasting/film/video in the Last Year:    ??? Barista in the Last Year:    Transportation Needs:    ??? Freight forwarder (Medical):    ??? Lack of Transportation (Non-Medical):    Physical Activity:    ??? Days of Exercise per Week:    ??? Minutes of Exercise per Session:    Stress:    ??? Feeling of Stress :    Social Connections:    ??? Frequency of Communication with Friends and Family:    ??? Frequency of Social Gatherings with Friends and Family:    ??? Attends Religious Services:    ??? Database administrator or Organizations:    ??? Attends Engineer, structural:    ??? Marital Status:    Intimate Programme researcher, broadcasting/film/video Violence:    ??? Fear of Current or Ex-Partner:    ??? Emotionally Abused:    ??? Physically Abused:    ??? Sexually Abused:      Family History   Problem Relation Age of Onset   ??? Diabetes Mother    ??? Diabetes Father        Review of Systems  Constitutional:   Negative for fever.  GI:   Negative for nausea.  Genitourinary: Positive for history of urolithiasis.      Urinalysis  UA - Dipstick  Results for orders placed or performed in visit on 07/14/20   AMB POC URINALYSIS DIP STICK AUTO W/O MICRO (PGU)     Status: None   Result Value Ref Range Status    Glucose (UA POC) Negative Negative mg/dL Final    Bilirubin (UA POC) Negative Negative Final    Ketones (UA POC) Negative Negative Final    Specific gravity (UA POC) 1.025 1.001 - 1.035 Final    Blood (UA POC) Negative Negative Final    pH (UA POC) 5.5 4.6 - 8.0 Final    Protein (UA POC) Negative Negative Final    Urobilinogen (POC) 0.2 mg/dL  Final    Nitrites (UA POC) Negative Negative Final    Leukocyte esterase (UA POC) Negative Negative Final       UA - Micro  WBC - 0  RBC - 0  Bacteria - 0  Epith - 0    PHYSICAL EXAM      GENERAL: No acute distress, Awake, Alert, Oriented X 3, Gait normal  CHEST AND LUNG: Easy work of breathing, clear to auscultation bilaterally, no cyanosis  CARDIAC: regular rate and rhythm  ABDOMEN: soft, non tender, non-distended, positive bowel sounds, no organomegaly, no palpable masses, no guarding, no rebound tenderness  SKIN: No rash, no erythema, no lacerations or abrasions, no ecchymosis  MUSCULOSKELETAL- normal gait and station.     KUB: Normal gas pattern is present.  I see no kidneys in either kidney.  Within the left proximal ureter there is a stone at the L4-L5  spine process.      Assessment and Plan    ICD-10-CM ICD-9-CM  1. Kidney stone  N20.0 592.0 AMB POC URINALYSIS DIP STICK AUTO W/O MICRO (PGU)      XR ABD (KUB)     PLAN:  Options of watchful waiting versus ureteroscopy's ESWL were discussed in detail.  Patient would like to treat the stone ASAP.  I will send refills of pain meds to the pharmacy.  We will arrange a left ESWL today if possible.    Treatment options with risk and benefits were discussed with patient in detail. Treatment options discussed including watchful waiting in hopes that the stone  will pass, ESWL and ureteroscopy with laser lithotripsy.  Plan is for left ESWL.  Risk include but are not limited to bleeding, infection, renal injury requiring repair, risk of anesthesia and risk of incomplete fragmentation of the stone requiring further treatments such as ureteroscopy or repeat ESWL.    Hilarie Fredrickson, NP  Dr. Archie Patten is supervising physician today and he approves plan of care.    Elements of this note have been dictated using speech recognition software.  Although reviewed, errors of speech recognition may have occurred.

## 2020-07-17 ENCOUNTER — Encounter

## 2020-07-17 ENCOUNTER — Encounter: Attending: Urology | Primary: Internal Medicine

## 2020-07-17 MED ORDER — HYDROCODONE-ACETAMINOPHEN 5 MG-325 MG TAB
5-325 mg | ORAL_TABLET | ORAL | 0 refills | Status: AC | PRN
Start: 2020-07-17 — End: 2020-07-20

## 2020-07-17 MED ORDER — ONDANSETRON 4 MG TAB, RAPID DISSOLVE
4 mg | ORAL_TABLET | Freq: Three times a day (TID) | ORAL | 1 refills | Status: DC | PRN
Start: 2020-07-17 — End: 2021-02-09

## 2020-07-17 NOTE — Telephone Encounter (Signed)
Pt states he will also need zofran sent to the pharm. 1 zofran per pain pill

## 2020-07-28 ENCOUNTER — Ambulatory Visit: Attending: Adult Health | Primary: Internal Medicine

## 2020-07-28 ENCOUNTER — Ambulatory Visit: Admit: 2020-07-28 | Discharge: 2020-07-28 | Payer: MEDICARE | Attending: Adult Health | Primary: Internal Medicine

## 2020-07-28 ENCOUNTER — Encounter: Admit: 2020-07-28 | Payer: MEDICARE | Primary: Internal Medicine

## 2020-07-28 DIAGNOSIS — N200 Calculus of kidney: Secondary | ICD-10-CM

## 2020-07-28 LAB — AMB POC URINALYSIS DIP STICK AUTO W/O MICRO (PGU)
Bilirubin (UA POC): NEGATIVE
Bilirubin, Urine, POC: NEGATIVE
Blood (UA POC): NEGATIVE
Blood (UA POC): NEGATIVE
Glucose (UA POC): 100 mg/dL
Glucose, Urine, POC: 100 mg/dL
KETONES, Urine, POC: NEGATIVE
Ketones (UA POC): NEGATIVE
Leukocyte Esterase, Urine, POC: NEGATIVE
Leukocyte esterase (UA POC): NEGATIVE
Nitrite, Urine, POC: NEGATIVE
Nitrites (UA POC): NEGATIVE
Protein (UA POC): NEGATIVE
Protein, Urine, POC: NEGATIVE
Specific Gravity, Urine, POC: 1.01 NA (ref 1.001–1.035)
Specific gravity (UA POC): 1.01 (ref 1.001–1.035)
Urobilinogen (POC): 0.2
Urobilinogen, POC: 0.2
pH (UA POC): 5 (ref 4.6–8.0)
pH, Urine, POC: 5 NA (ref 4.6–8.0)

## 2020-07-28 NOTE — Progress Notes (Signed)
[facility]  [address]   [address]  [place], [place] [address]  [phone]    Calvin Hughes  DOB: 1951/03/15    Chief Complaint   Patient presents with   ??? Post OP Follow Up          HPI     Calvin Hughes is a 69 y.o. male    Status post left ESWL back today for follow-up.  He brings in several stone fragments that he passed this procedure.  He is not had any pain.  Urine today is actually clear.      Past Medical History:   Diagnosis Date   ??? Diabetes (HCC)      No past surgical history on file.  Current Outpatient Medications   Medication Sig Dispense Refill   ??? ondansetron (ZOFRAN ODT) 4 mg disintegrating tablet Take 1 Tablet by mouth every eight (8) hours as needed for Nausea or Vomiting. 15 Tablet 1   ??? acetaminophen (TylenoL) 325 mg tablet Take  by mouth every four (4) hours as needed for Pain.     ??? ibuprofen (Motrin IB) 200 mg tablet Take  by mouth.     ??? tamsulosin (Flomax) 0.4 mg capsule Take 0.4 mg by mouth daily.     ??? ondansetron hcl (Zofran) 4 mg tablet Take 4 mg by mouth every eight (8) hours as needed for Nausea or Vomiting.     ??? allopurinoL (ZYLOPRIM) 300 mg tablet as directed     ??? loratadine 10 mg cap Take 10 mg by mouth.     ??? betamethasone dipropionate (DIPROSONE) 0.05 % topical cream Apply  to affected area daily as needed.     ??? atorvastatin (LIPITOR) 20 mg tablet TAKE ONE TABLET BY MOUTH EVERY NIGHT     ??? fexofenadine (ALLEGRA) 180 mg tablet Take 180 mg by mouth two (2) times a day.     ??? lancets 33 gauge misc by Not Applicable route daily.     ??? OneTouch Verio Flex meter misc ONE MISCELLANEOUS ONE TIME DAILY     ??? OneTouch Verio test strips strip TEST ONCE DAILY       No Known Allergies  Social History     Socioeconomic History   ??? Marital status: MARRIED     Spouse name: Not on file   ??? Number of children: Not on file   ??? Years of education: Not on file   ??? Highest education level: Not on file   Occupational History   ??? Not on file   Tobacco Use   ??? Smoking status:  Never Smoker   Substance and Sexual Activity   ??? Alcohol use: Yes   ??? Drug use: Not on file   ??? Sexual activity: Not on file   Other Topics Concern   ??? Not on file   Social History Narrative   ??? Not on file     Social Determinants of Health     Financial Resource Strain:    ??? Difficulty of Paying Living Expenses:    Food Insecurity:    ??? Worried About Programme researcher, broadcasting/film/video in the Last Year:    ??? Barista in the Last Year:    Transportation Needs:    ??? Freight forwarder (Medical):    ??? Lack of Transportation (Non-Medical):    Physical Activity:    ??? Days of Exercise per Week:    ??? Minutes of Exercise per Session:  Stress:    ??? Feeling of Stress :    Social Connections:    ??? Frequency of Communication with Friends and Family:    ??? Frequency of Social Gatherings with Friends and Family:    ??? Attends Religious Services:    ??? Database administrator or Organizations:    ??? Attends Engineer, structural:    ??? Marital Status:    Intimate Programme researcher, broadcasting/film/video Violence:    ??? Fear of Current or Ex-Partner:    ??? Emotionally Abused:    ??? Physically Abused:    ??? Sexually Abused:      Family History   Problem Relation Age of Onset   ??? Diabetes Mother    ??? Diabetes Father        Review of Systems  Constitutional:   Negative for fever.  GI:  Negative for nausea.  Genitourinary: Positive for history of urolithiasis.  Musculoskeletal:  Negative for back pain.      Urinalysis  UA - Dipstick  Results for orders placed or performed in visit on 07/28/20   AMB POC URINALYSIS DIP STICK AUTO W/O MICRO (PGU)     Status: None   Result Value Ref Range Status    Glucose (UA POC) 100  Negative mg/dL Final    Bilirubin (UA POC) Negative Negative Final    Ketones (UA POC) Negative Negative Final    Specific gravity (UA POC) 1.010 1.001 - 1.035 Final    Blood (UA POC) Negative Negative Final    pH (UA POC) 5.0 4.6 - 8.0 Final    Protein (UA POC) Negative Negative Final    Urobilinogen (POC) 0.2 mg/dL  Final    Nitrites (UA POC) Negative  Negative Final    Leukocyte esterase (UA POC) Negative Negative Final       UA - Micro  WBC - 0  RBC - 0  Bacteria - 0  Epith - 0    KUB today is clear.  No stones remain.    Assessment and Plan    ICD-10-CM ICD-9-CM    1. Kidney stone  N20.0 592.0 AMB POC URINALYSIS DIP STICK AUTO W/O MICRO (PGU)      XR ABD (KUB)      STONE ANALYSIS, URINARY     PLAN:  Stone prevention was discussed.  We will send stone for analysis  I will smell information on this once results are back.     Hilarie Fredrickson, NP  Dr. Rozann Lesches is supervising physician today and he approves plan of care.    Follow-up and Dispositions    ?? Return if symptoms worsen or fail to improve.       Elements of this note have been dictated using speech recognition software.  Although reviewed, errors of speech recognition may have occurred.

## 2020-07-28 NOTE — Progress Notes (Signed)
Stone analysis revealed the stone to be 100% calcium oxalate.  We will mail you stone prevention info.

## 2020-08-02 LAB — STONE ANALYSIS, URINARY
CALCIUM OXALATE DIHYDRATE,18084: 10 %
CALCIUM OXALATE MONOHYDRATE,18087: 90 %
Calcium Oxalate Dihydrate: 10 %
Calcium Oxalate Monohydrate: 90 %
Weight: 20 mg
Weight: 20 mg

## 2020-12-12 LAB — HEMOGLOBIN A1C: Hemoglobin A1C, External: 6.8 % (ref 3.2–7.0)

## 2021-02-09 ENCOUNTER — Ambulatory Visit: Attending: Urology | Primary: Internal Medicine

## 2021-02-09 ENCOUNTER — Ambulatory Visit: Admit: 2021-02-09 | Discharge: 2021-02-09 | Payer: MEDICARE | Attending: Urology | Primary: Internal Medicine

## 2021-02-09 ENCOUNTER — Encounter: Attending: Urology | Primary: Internal Medicine

## 2021-02-09 DIAGNOSIS — N200 Calculus of kidney: Secondary | ICD-10-CM

## 2021-02-09 LAB — AMB POC URINALYSIS DIP STICK AUTO W/O MICRO (PGU)
Bilirubin (UA POC): NEGATIVE
Bilirubin, Urine, POC: NEGATIVE
Blood (UA POC): NEGATIVE
Blood (UA POC): NEGATIVE
Glucose (UA POC): NEGATIVE mg/dL
Glucose, Urine, POC: NEGATIVE mg/dL
KETONES, Urine, POC: NEGATIVE
Ketones (UA POC): NEGATIVE
Leukocyte Esterase, Urine, POC: NEGATIVE
Leukocyte esterase (UA POC): NEGATIVE
Nitrite, Urine, POC: NEGATIVE
Nitrites (UA POC): NEGATIVE
Protein (UA POC): NEGATIVE
Protein, Urine, POC: NEGATIVE
Specific Gravity, Urine, POC: 1.025 NA (ref 1.001–1.035)
Specific gravity (UA POC): 1.025 (ref 1.001–1.035)
Urobilinogen (POC): 0.2
Urobilinogen, POC: 0.2
pH (UA POC): 5.5 (ref 4.6–8.0)
pH, Urine, POC: 5.5 NA (ref 4.6–8.0)

## 2021-02-09 NOTE — Progress Notes (Signed)
[facility]  [address]  [address]  [place], [place] [address]  [phone]    Calvin Hughes  DOB: 10/22/1950    Chief Complaint   Patient presents with   ??? Follow-up          HPI     Calvin Hughes is a 70 y.o. Caucasian male with a PMH of kidney stones and PD who presents for annual follow up.     Seen 02/2020. Today, he reports no change in his PD and does not want treatment for that.  He did have L ESWL 07/2020 by Dr. Rozann Lesches for passing L kidney stones.  No issues since the procedure.  No flank pain, hematuria, urinary retention, urgency, frequency, urinary incontinence.  He is not on any prostate/bladder meds.     PSA with PCP 12/2020 normal and stable at 1.940    KUB shows 2 mm stones in R kidney X 1 and L kidney X 3, all non-obstructing.     Past Medical History:   Diagnosis Date   ??? Diabetes (HCC)      No past surgical history on file.  Current Outpatient Medications   Medication Sig Dispense Refill   ??? allopurinoL (ZYLOPRIM) 300 mg tablet as directed     ??? betamethasone dipropionate (DIPROSONE) 0.05 % topical cream Apply  to affected area daily as needed.     ??? atorvastatin (LIPITOR) 20 mg tablet TAKE ONE TABLET BY MOUTH EVERY NIGHT     ??? fexofenadine (ALLEGRA) 180 mg tablet Take 180 mg by mouth two (2) times a day.     ??? lancets 33 gauge misc by Not Applicable route daily.     ??? OneTouch Verio Flex meter misc ONE MISCELLANEOUS ONE TIME DAILY     ??? OneTouch Verio test strips strip TEST ONCE DAILY       No Known Allergies  Social History     Socioeconomic History   ??? Marital status: MARRIED     Spouse name: Not on file   ??? Number of children: Not on file   ??? Years of education: Not on file   ??? Highest education level: Not on file   Occupational History   ??? Not on file   Tobacco Use   ??? Smoking status: Never Smoker   ??? Smokeless tobacco: Not on file   Substance and Sexual Activity   ??? Alcohol use: Yes   ??? Drug use: Not on file   ??? Sexual activity: Not on file   Other Topics Concern   ??? Not  on file   Social History Narrative   ??? Not on file     Social Determinants of Health     Financial Resource Strain:    ??? Difficulty of Paying Living Expenses: Not on file   Food Insecurity:    ??? Worried About Running Out of Food in the Last Year: Not on file   ??? Ran Out of Food in the Last Year: Not on file   Transportation Needs:    ??? Lack of Transportation (Medical): Not on file   ??? Lack of Transportation (Non-Medical): Not on file   Physical Activity:    ??? Days of Exercise per Week: Not on file   ??? Minutes of Exercise per Session: Not on file   Stress:    ??? Feeling of Stress : Not on file   Social Connections:    ??? Frequency of Communication with Friends and Family: Not on file   ???  Frequency of Social Gatherings with Friends and Family: Not on file   ??? Attends Religious Services: Not on file   ??? Active Member of Clubs or Organizations: Not on file   ??? Attends Banker Meetings: Not on file   ??? Marital Status: Not on file   Intimate Partner Violence:    ??? Fear of Current or Ex-Partner: Not on file   ??? Emotionally Abused: Not on file   ??? Physically Abused: Not on file   ??? Sexually Abused: Not on file   Housing Stability:    ??? Unable to Pay for Housing in the Last Year: Not on file   ??? Number of Places Lived in the Last Year: Not on file   ??? Unstable Housing in the Last Year: Not on file     Family History   Problem Relation Age of Onset   ??? Diabetes Mother    ??? Diabetes Father        Review of Systems  Constitutional:   Negative for fever, chills, appetite change, malaise/fatigue, headaches and weight loss.  Skin: Positive for rash and itching. Negative for skin lesions.  Eyes:  Negative for visual disturbance, eye pain and eye discharge.  ENT:  Negative for difficulty articulating words, pain swallowing, high frequency hearing loss and dry mouth.  Respiratory:  Negative for cough, blood in sputum, shortness of breath and wheezing.  Cardiovascular:  Negative for chest pain, hypertension, irregular  heartbeat, leg pain, leg swelling, regular rate and rhythm and varicose veins.  GI:  Negative for nausea, vomiting, abdominal pain, blood in stool, constipation, diarrhea, indigestion and heartburn.  Genitourinary: Positive for urinary burning. Negative for hematuria, flank pain, recurrent UTIs, history of urolithiasis, nocturia, slower stream, straining, urgency, leakage w/ urge, frequent urination, incomplete emptying, erectile dysfunction, testicular pain, sexually transmitted disease, discharge and urethral stricture.  Musculoskeletal: Positive for arthralgias. Negative for back pain, bone pain, tenderness, muscle weakness and neck pain.  Neurological:  Negative for dizziness, focal weakness, numbness, seizures and tremors.  Psychological:  Negative for depression and psychiatric problem.  Endocrine:  Negative for cold intolerance, thirst, excessive urination, fatigue and heat intolerance.  Hem/Lymphatic:  Negative for easy bleeding, easy bruising and frequent infections.      Urinalysis  UA - Dipstick  Results for orders placed or performed in visit on 02/09/21   AMB POC URINALYSIS DIP STICK AUTO W/O MICRO (PGU)     Status: None   Result Value Ref Range Status    Glucose (UA POC) Negative Negative mg/dL Final    Bilirubin (UA POC) Negative Negative Final    Ketones (UA POC) Negative Negative Final    Specific gravity (UA POC) 1.025 1.001 - 1.035 Final    Blood (UA POC) Negative Negative Final    pH (UA POC) 5.5 4.6 - 8.0 Final    Protein (UA POC) Negative Negative Final    Urobilinogen (POC) 0.2 mg/dL  Final    Nitrites (UA POC) Negative Negative Final    Leukocyte esterase (UA POC) Negative Negative Final       There were no vitals taken for this visit.     GENERAL: No acute distress, Awake, Alert, Oriented X 3, Gait normal  CARDIAC: regular rate and rhythm  CHEST AND LUNG: Easy work of breathing, clear to auscultation bilaterally, no cyanosis  ABDOMEN: soft, non tender, non-distended, positive bowel sounds,  no organomegaly, no palpable masses, no guarding, no rebound tenderness  DRE: 25 gram, symmetric, smooth  without nodules.   SKIN: No rash, no erythema, no lacerations or abrasions, no ecchymosis  NEUROLOGIC: cranial nerves 2-12 grossly intact           Assessment and Plan    ICD-10-CM ICD-9-CM    1. Kidney stone  N20.0 592.0 AMB POC URINALYSIS DIP STICK AUTO W/O MICRO (PGU)   2. Elevated PSA  R97.20 790.93 AMB POC URINALYSIS DIP STICK AUTO W/O MICRO (PGU)      AMB POC XRAY ABDOMEN 1 VIEW   3. 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  Will monitor.  Follow up 1 year for KUB    I have educated pt. about diet modifications that can decrease the risk of future calcium stone formation.  These include decreasing things high in oxalate such as teas and colas.  Also, I have encouraged pt. to increase clear liquid intake, especially H2O, and decrease dietary sodium intake.  Things high in citrate such as lemon juice should also be increased.    PSA:   Normal this year.  Follow up 1 year with PSA.  Gets these done at [facility] with PCP but wants annual DRE here    PD:   Stable.  No treatment indicated at this time    I have spent 30 minutes today reviewing previous notes, test results and face to face with the patient as well as documenting.      Keyonia Gluth A. Darrol Poke, M.D.    [facility]  [facility]  [address]  [place], [place] [address]  Phone: [phone]  Fax: [phone]

## 2021-03-23 LAB — HEMOGLOBIN A1C: Hemoglobin A1C, External: 6.5 % (ref 3.2–7.0)

## 2021-07-10 LAB — HEMOGLOBIN A1C: Hemoglobin A1C, External: 6.8 % (ref 3.2–7.0)

## 2021-12-18 LAB — HEMOGLOBIN A1C: Hemoglobin A1C, External: 6.7 % (ref 3.2–7.0)

## 2022-02-15 ENCOUNTER — Encounter: Admit: 2022-02-15 | Discharge: 2022-02-15 | Payer: MEDICARE | Attending: Urology | Primary: Internal Medicine

## 2022-02-15 ENCOUNTER — Encounter: Attending: Urology | Primary: Internal Medicine

## 2022-02-15 DIAGNOSIS — N200 Calculus of kidney: Secondary | ICD-10-CM

## 2022-02-15 LAB — AMB POC URINALYSIS DIP STICK AUTO W/O MICRO
Bilirubin, Urine, POC: NEGATIVE
Blood (UA POC): NEGATIVE
Glucose, Urine, POC: NEGATIVE
KETONES, Urine, POC: NEGATIVE
Leukocyte Esterase, Urine, POC: NEGATIVE
Nitrite, Urine, POC: NEGATIVE
Protein, Urine, POC: NEGATIVE
Specific Gravity, Urine, POC: 1.015 (ref 1.001–1.035)
Urobilinogen, POC: 0.2
pH, Urine, POC: 5.5 (ref 4.6–8.0)

## 2022-02-15 MED ORDER — TAMSULOSIN HCL 0.4 MG PO CAPS
0.4 MG | ORAL_CAPSULE | Freq: Every evening | ORAL | 3 refills | Status: DC
Start: 2022-02-15 — End: 2023-02-14

## 2022-02-15 NOTE — Progress Notes (Signed)
[facility]  [address]    [address]  [place], [place] [address]  [phone]    Calvin Hughes  DOB: 1950/12/15    Chief Complaint   Patient presents with    Follow-up          HPI     Calvin Hughes is a 71 y.o. male with a PMH of kidney stones and PD who presents for annual follow up.      Seen 02/2021. Today, he reports no change in his PD and does not want treatment for that.  He did have L ESWL 07/2020 by Dr. Rozann Lesches for passing L kidney stones.  No issues since the procedure.  No flank pain, hematuria, urinary retention, urgency, frequency, urinary incontinence.  He is on flomax and requests refills today.      PSA with PCP 12/2021 and normal.      KUB shows 2 mm stones in R kidney X 1 and L kidney X 3, all non-obstructing.     Of note, his brother recently ws diagnosed with bladder cancer and is getting BCG treatments.  He I concerned about getting this as well.  He has no history of tobacco use.     Past Medical History:   Diagnosis Date    Diabetes (HCC)      History reviewed. No pertinent surgical history.  Current Outpatient Medications   Medication Sig Dispense Refill    tamsulosin (FLOMAX) 0.4 MG capsule Take 1 capsule by mouth nightly 90 capsule 3    acetaminophen (TYLENOL) 325 MG tablet Take by mouth every 4 hours as needed      allopurinol (ZYLOPRIM) 300 MG tablet As directed      atorvastatin (LIPITOR) 20 MG tablet TAKE ONE TABLET BY MOUTH EVERY NIGHT      betamethasone dipropionate 0.05 % cream Apply topically daily as needed      fexofenadine (ALLEGRA) 180 MG tablet Take 1 tablet by mouth 2 times daily      ibuprofen (ADVIL;MOTRIN) 200 MG tablet Take by mouth      loratadine (CLARITIN) 10 MG capsule Take 1 capsule by mouth      ondansetron (ZOFRAN-ODT) 4 MG disintegrating tablet Take 1 tablet by mouth every 8 hours as needed      ondansetron (ZOFRAN) 4 MG tablet Take 1 tablet by mouth every 8 hours as needed       No current facility-administered medications for this visit.     No  Known Allergies  Social History     Socioeconomic History    Marital status: Married     Spouse name: Not on file    Number of children: Not on file    Years of education: Not on file    Highest education level: Not on file   Occupational History    Not on file   Tobacco Use    Smoking status: Never    Smokeless tobacco: Not on file   Substance and Sexual Activity    Alcohol use: Yes    Drug use: Not on file    Sexual activity: Not on file   Other Topics Concern    Not on file   Social History Narrative    Not on file     Social Determinants of Health     Financial Resource Strain: Not on file   Food Insecurity: Not on file   Transportation Needs: Not on file   Physical Activity: Not on file   Stress:  Not on file   Social Connections: Not on file   Intimate Partner Violence: Not on file   Housing Stability: Not on file     Family History   Problem Relation Age of Onset    Diabetes Mother     Diabetes Father        Review of Systems  Constitutional:   Negative for fever, chills, appetite change, malaise/fatigue, headaches and weight loss.  Skin:  Negative for skin lesions, rash and itching.  Eyes:  Negative for visual disturbance, eye pain and eye discharge.  ENT:  Negative for difficulty articulating words, pain swallowing, high frequency hearing loss and dry mouth.  Respiratory:  Negative for cough, blood in sputum, shortness of breath and wheezing.  Cardiovascular:  Negative for chest pain, hypertension, irregular heartbeat, leg pain, leg swelling, regular rate and rhythm and varicose veins.  GI:  Negative for nausea, vomiting, abdominal pain, blood in stool, constipation, diarrhea, indigestion and heartburn.  Genitourinary:  Negative for urinary burning, hematuria, flank pain, recurrent UTIs, history of urolithiasis, nocturia, slower stream, straining, urgency, leakage w/ urge, frequent urination, incomplete emptying, erectile dysfunction, testicular pain, sexually transmitted disease, discharge and urethral  stricture.  Musculoskeletal:  Negative for back pain, bone pain, arthralgias, tenderness, muscle weakness and neck pain.  Neurological:  Negative for dizziness, focal weakness, numbness, seizures and tremors.  Psychological:  Negative for depression and psychiatric problem.  Endocrine:  Negative for cold intolerance, thirst, excessive urination, fatigue and heat intolerance.  Hem/Lymphatic:  Negative for easy bleeding, easy bruising and frequent infections.    Urinalysis  UA - Dipstick  Results for orders placed or performed in visit on 02/15/22   AMB POC URINALYSIS DIP STICK AUTO W/O MICRO   Result Value Ref Range    Color (UA POC)      Clarity (UA POC)      Glucose, Urine, POC Negative Negative    Bilirubin, Urine, POC Negative Negative    KETONES, Urine, POC Negative Negative    Specific Gravity, Urine, POC 1.015 1.001 - 1.035    Blood (UA POC) Negative Negative    pH, Urine, POC 5.5 4.6 - 8.0    Protein, Urine, POC Negative Negative    Urobilinogen, POC 0.2 mg/dL     Nitrite, Urine, POC Negative Negative    Leukocyte Esterase, Urine, POC Negative Negative       UA - Micro  WBC - 0  RBC - 0  Bacteria - 0  Epith - 0    There were no vitals taken for this visit.     GENERAL: No acute distress, Awake, Alert, Oriented X 3, Gait normal  CARDIAC: regular rate and rhythm  CHEST AND LUNG: Easy work of breathing, clear to auscultation bilaterally, no cyanosis  ABDOMEN: soft, non tender, non-distended, positive bowel sounds, no organomegaly, no palpable masses, no guarding, no rebound tenderness  DRE: 25 gram, symmetric, smooth without nodules  SKIN: No rash, no erythema, no lacerations or abrasions, no ecchymosis  NEUROLOGIC: cranial nerves 2-12 grossly intact         Assessment and Plan    ICD-10-CM    1. Calculus of kidney  N20.0 AMB POC URINALYSIS DIP STICK AUTO W/O MICRO     AMB POC XRAY ABDOMEN 1 VIEW      2. Benign prostatic hyperplasia with urinary frequency  N40.1 tamsulosin (FLOMAX) 0.4 MG capsule    R35.0          Kidney Stones:   Small  2 mm, non-obstructing on KUB.  Will monitor.  Follow up 1 year for KUB       PSA:   Normal this year.  Follow up 1 year with PSA.  Gets these done at [facility] with PCP but wants annual DRE here     PD:   Stable.  No treatment indicated at this time    BPH:   Flomax refilled.  Doing well.     FH of Bladder Cancer:   Discussed today.  Non-smoker and no gross or micro hematuria on U/A.  If develops then will proceed with bladder cancer work up.     I have spent 30 minutes today reviewing previous notes, test results and face to face with the patient as well as documenting.      Augie Vane A. Darrol Poke, M.D.    [facility]  [facility]  [address]  [place], [place] [address]  Phone: [phone]  Fax: [phone]

## 2022-04-23 LAB — HEMOGLOBIN A1C: Hemoglobin A1C, External: 7.4 % — ABNORMAL HIGH (ref 3.2–7.0)

## 2022-08-20 LAB — HEMOGLOBIN A1C: Hemoglobin A1C, External: 6.7 % (ref 3.2–7.0)

## 2023-01-07 LAB — HEMOGLOBIN A1C: Hemoglobin A1C, External: 6.9 % — ABNORMAL HIGH

## 2023-02-14 ENCOUNTER — Encounter: Admit: 2023-02-14 | Discharge: 2023-02-14 | Payer: MEDICARE | Attending: Urology | Primary: Internal Medicine

## 2023-02-14 ENCOUNTER — Encounter

## 2023-02-14 DIAGNOSIS — N200 Calculus of kidney: Secondary | ICD-10-CM

## 2023-02-14 LAB — AMB POC URINALYSIS DIP STICK AUTO W/O MICRO
Bilirubin, Urine, POC: NEGATIVE
Blood (UA POC): NEGATIVE
Glucose, Urine, POC: NEGATIVE
KETONES, Urine, POC: NEGATIVE
Leukocyte Esterase, Urine, POC: NEGATIVE
Nitrite, Urine, POC: NEGATIVE
Protein, Urine, POC: NEGATIVE
Specific Gravity, Urine, POC: 1.02 (ref 1.001–1.035)
Urobilinogen, POC: 0.2
pH, Urine, POC: 5.5 (ref 4.6–8.0)

## 2023-02-14 LAB — PSA, DIAGNOSTIC: PSA: 2.5 ng/mL (ref 0.0–4.0)

## 2023-02-14 NOTE — Progress Notes (Signed)
[facility]  [address]   [address]  [place], [place] [address]  [phone]    Calvin Hughes  DOB: Sep 17, 1951    CC: Kidney Stones and PD     HPI     Calvin Hughes is a 72 y.o. male with a PMH of kidney stones and PD who presents for annual follow up.      Seen 02/2022. Today, he reports no change in his PD and does not want treatment for that.  He did have L ESWL 07/2020 by Dr. Rozann Lesches for passing L kidney stones.  No issues since the procedure.  No flank pain, hematuria, urinary retention, urgency, frequency, urinary incontinence.  He is on flomax and requests refills today.      PSA with PCP 12/2021 and normal. PSA due today.      KUB shows 2 mm stones in R kidney X 1 and L kidney X 3, all non-obstructing.      Of note, his brother recently ws diagnosed with bladder cancer and is getting BCG treatments.  He I concerned about getting this as well.  He has no history of tobacco use.       Past Medical History:   Diagnosis Date    Diabetes (HCC)      History reviewed. No pertinent surgical history.  Current Outpatient Medications   Medication Sig Dispense Refill    acetaminophen (TYLENOL) 325 MG tablet Take by mouth every 4 hours as needed      allopurinol (ZYLOPRIM) 300 MG tablet As directed      atorvastatin (LIPITOR) 20 MG tablet TAKE ONE TABLET BY MOUTH EVERY NIGHT      betamethasone dipropionate 0.05 % cream Apply topically daily as needed      ibuprofen (ADVIL;MOTRIN) 200 MG tablet Take by mouth      loratadine (CLARITIN) 10 MG capsule Take 1 capsule by mouth       No current facility-administered medications for this visit.     No Known Allergies  Social History     Socioeconomic History    Marital status: Married     Spouse name: Not on file    Number of children: Not on file    Years of education: Not on file    Highest education level: Not on file   Occupational History    Not on file   Tobacco Use    Smoking status: Never    Smokeless tobacco: Not on file   Substance and Sexual Activity     Alcohol use: Yes    Drug use: Not on file    Sexual activity: Not on file   Other Topics Concern    Not on file   Social History Narrative    Not on file     Social Determinants of Health     Financial Resource Strain: Not on file   Food Insecurity: Not on file   Transportation Needs: Not on file   Physical Activity: Not on file   Stress: Not on file   Social Connections: Not on file   Intimate Partner Violence: Not on file   Housing Stability: Not on file     Family History   Problem Relation Age of Onset    Diabetes Mother     Diabetes Father        Review of Systems  Constitutional:   Negative for fever, chills, appetite change, malaise/fatigue, headaches and weight loss.  Skin:  Negative for skin lesions, rash  and itching.  Eyes:  Negative for visual disturbance, eye pain and eye discharge.  ENT:  Negative for difficulty articulating words, pain swallowing, high frequency hearing loss and dry mouth.  Respiratory:  Negative for cough, blood in sputum, shortness of breath and wheezing.  Cardiovascular:  Negative for chest pain, hypertension, irregular heartbeat, leg pain, leg swelling, regular rate and rhythm and varicose veins.  GI:  Negative for nausea, vomiting, abdominal pain, blood in stool, constipation, diarrhea, indigestion and heartburn.  Genitourinary:  Negative for urinary burning, hematuria, flank pain, recurrent UTIs, history of urolithiasis, nocturia, slower stream, straining, urgency, leakage w/ urge, frequent urination, incomplete emptying, erectile dysfunction, testicular pain, sexually transmitted disease, discharge and urethral stricture.  Musculoskeletal:  Negative for back pain, bone pain, arthralgias, tenderness, muscle weakness and neck pain.  Neurological:  Negative for dizziness, focal weakness, numbness, seizures and tremors.  Psychological:  Negative for depression and psychiatric problem.  Endocrine:  Negative for cold intolerance, thirst, excessive urination, fatigue and heat  intolerance.  Hem/Lymphatic:  Negative for easy bleeding, easy bruising and frequent infections.      Urinalysis  UA - Dipstick  @LASTPROCAMB ([id number])@    UA - Micro  WBC - 0  RBC - 0  Bacteria - 0  Epith - 0    There were no vitals taken for this visit.     GENERAL: No acute distress, Awake, Alert, Oriented X 3, Gait normal  CARDIAC: regular rate and rhythm  CHEST AND LUNG: Easy work of breathing, clear to auscultation bilaterally, no cyanosis  ABDOMEN: soft, non tender, non-distended, positive bowel sounds, no organomegaly, no palpable masses, no guarding, no rebound tenderness  DRE: 25 gram, symmetric, smooth without nodules   SKIN: No rash, no erythema, no lacerations or abrasions, no ecchymosis  NEUROLOGIC: cranial nerves 2-12 grossly intact       Assessment and Plan    ICD-10-CM    1. Calculus of kidney  N20.0 AMB POC URINALYSIS DIP STICK AUTO W/O MICRO     AMB POC XRAY ABDOMEN 1 VIEW      2. Benign prostatic hyperplasia with urinary frequency  N40.1 AMB POC URINALYSIS DIP STICK AUTO W/O MICRO    R35.0 PSA, Diagnostic     PSA, Diagnostic      3. Induration penis plastica  N48.6 AMB POC URINALYSIS DIP STICK AUTO W/O MICRO      4. Screening PSA (prostate specific antigen)  Z12.5 AMB POC URINALYSIS DIP STICK AUTO W/O MICRO          Kidney Stones:   Small 2 mm, non-obstructing on KUB.  Will monitor.  Follow up 1 year for KUB        PSA:   Normal this year.  Follow up 1 year with PSA.  Gets these done at [facility] with PCP but wants annual DRE here     PD:   Stable.  No treatment indicated at this time     BPH:   Flomax refilled.  Doing well.      FH of Bladder Cancer:   Discussed today.  Non-smoker and no gross or micro hematuria on U/A.  If develops then will proceed with bladder cancer work up.     I have spent 30 minutes today reviewing previous notes, test results and face to face with the patient as well as documenting.      Tamilyn Lupien A. Darrol Poke, M.D.    [facility]  [facility]  [address] [place] [address]  Phone: [phone]  Fax: [phone]

## 2023-02-18 NOTE — Other (Signed)
Calvin Hughes, your PSA is stable at 2.5.  This is great news!  Please keep your follow up with me next year as scheduled.     Best Regards,  Dr. Darrol Poke

## 2023-05-13 LAB — HEMOGLOBIN A1C: Hemoglobin A1C, External: 6.7 % — ABNORMAL HIGH

## 2023-09-09 LAB — HEMOGLOBIN A1C: Hemoglobin A1C, External: 7.7 % — ABNORMAL HIGH

## 2024-02-02 LAB — HEMOGLOBIN A1C: Hemoglobin A1C, External: 7.3 % — ABNORMAL HIGH

## 2024-02-10 ENCOUNTER — Other Ambulatory Visit: Admit: 2024-02-10 | Discharge: 2024-02-10 | Payer: MEDICARE | Primary: Internal Medicine

## 2024-02-10 DIAGNOSIS — R350 Frequency of micturition: Secondary | ICD-10-CM

## 2024-02-10 LAB — PSA, DIAGNOSTIC: PSA: 2.2 ng/mL (ref 0.0–4.0)

## 2024-02-13 ENCOUNTER — Ambulatory Visit: Admit: 2024-02-13 | Discharge: 2024-02-13 | Payer: MEDICARE | Attending: Urology | Primary: Internal Medicine

## 2024-02-13 DIAGNOSIS — N200 Calculus of kidney: Secondary | ICD-10-CM

## 2024-02-13 LAB — AMB POC URINALYSIS DIP STICK AUTO W/O MICRO
Bilirubin, Urine, POC: NEGATIVE
Glucose, Urine, POC: NEGATIVE mg/dL
KETONES, Urine, POC: 15 mg/dL
Leukocyte Esterase, Urine, POC: NEGATIVE
Nitrite, Urine, POC: NEGATIVE
Protein, Urine, POC: NEGATIVE mg/dL
Specific Gravity, Urine, POC: 1.025 (ref 1.001–1.035)
Urobilinogen, POC: 0.2 mg/dL (ref <1.1)
pH, Urine, POC: 5.5 (ref 4.6–8.0)

## 2024-02-14 NOTE — Progress Notes (Addendum)
 [facility]  [address]   [address]  [place] [address]  [phone]    Calvin Hughes  DOB: Jan 10, 1951    Chief Complaint   Patient presents with    Follow-up          HPI     Calvin Hughes is a 73 y.o. male    History of Present Illness  The patient is a 73 year old gentleman with a history of kidney stones and Peyronie's disease who opted for no treatment for the Peyronie's disease as well as lower urinary tract symptoms on Flomax  0.4 mg daily. He also has a family history of bladder cancer in the past. He returns for an annual follow-up.    He reports no problems. Urine shows trace blood, no nitrites, no leukocytes. He had a KUB last year that showed a 2 mm stone on the right and three 2 mm stones in the left, all nonobstructing. KUB this year shows no visible stones in the right or left kidney or bladder. PSA is 2.2 in May 2025. He expresses a desire to continue with annual follow-ups. He has not needed to use flomax  lately but has it on hand for PRN usage.    FAMILY HISTORY  He has a family history of bladder cancer.    MEDICATIONS  Flomax  (not taken)    Past Medical History:   Diagnosis Date    Diabetes (HCC)        No past surgical history on file.    Current Outpatient Medications   Medication Sig Dispense Refill    acetaminophen (TYLENOL) 325 MG tablet Take by mouth every 4 hours as needed      allopurinol (ZYLOPRIM) 300 MG tablet As directed      atorvastatin (LIPITOR) 20 MG tablet TAKE ONE TABLET BY MOUTH EVERY NIGHT      betamethasone dipropionate 0.05 % cream Apply topically daily as needed      ibuprofen (ADVIL;MOTRIN) 200 MG tablet Take by mouth      loratadine (CLARITIN) 10 MG capsule Take 1 capsule by mouth       No current facility-administered medications for this visit.       No Known Allergies    Social History     Socioeconomic History    Marital status: Married     Spouse name: Not on file    Number of children: Not on file    Years of education: Not on file     Highest education level: Not on file   Occupational History    Not on file   Tobacco Use    Smoking status: Never    Smokeless tobacco: Not on file   Substance and Sexual Activity    Alcohol use: Yes    Drug use: Not on file    Sexual activity: Not on file   Other Topics Concern    Not on file   Social History Narrative    Not on file     Social Drivers of Health     Financial Resource Strain: Not At Risk (02/12/2024)    Received from [facility] - Financial Strain     Difficulty of Paying Living Expenses: Not hard at all   Food Insecurity: No Food Insecurity (02/12/2024)    Received from [facility]    Hunger Vital Sign     Worried About Running Out of Food in the Last Year: Never true     Ran  Out of Food in the Last Year: Never true   Transportation Needs: Not At Risk (02/12/2024)    Received from [facility] - Transportation     Lack of Transportation: No   Physical Activity: Insufficiently Active (02/12/2024)    Received from [facility]    Physical Activity     Days of Exercise per Week: 1 day     On average, how many minutes do you exercise per day?: 20     Total Minutes of Exercise Per Week: 20   Stress: No Stress Concern Present (12/18/2023)    Received from [facility]    Stress     Feeling of Stress : Not at all   Social Connections: Not At Risk (02/12/2024)    Received from [facility] - Social Connections     Needs Help with Day-to-Day Activities: I don't need any help     Frequency of Feeling Lonely or Isolated: Never   Intimate Partner Violence: Unknown (12/24/2019)    Received from [facility]    Intimate Partner Violence     Fear of Current or Ex-Partner: Not asked     Emotionally Abused: Not asked     Physically Abused: Not asked     Sexually Abused: Not asked   Housing Stability: Not At Risk (12/18/2023)    Received from [facility] Stability     Was there a time when you did not have a steady place to sleep: No     Worried that the place you are staying is making  you sick: No       Family History   Problem Relation Age of Onset    Diabetes Mother     Diabetes Father        Review of Systems  Constitutional:   Negative for fever, chills, appetite change, malaise/fatigue, headaches and weight loss.  Skin:  Negative for skin lesions, rash and itching.  Eyes:  Negative for visual disturbance, eye pain and eye discharge.  ENT:  Negative for difficulty articulating words, pain swallowing, high frequency hearing loss and dry mouth.  Respiratory:  Negative for cough, blood in sputum, shortness of breath and wheezing.  Cardiovascular:  Negative for chest pain, hypertension, irregular heartbeat, leg pain, leg swelling, regular rate and rhythm and varicose veins.  GI:  Negative for nausea, vomiting, abdominal pain, blood in stool, constipation, diarrhea, indigestion and heartburn.  Genitourinary: Positive for history of urolithiasis, urgency and frequent urination. Negative for urinary burning, hematuria, flank pain, recurrent UTIs, nocturia, slower stream, straining, leakage w/ urge, incomplete emptying, erectile dysfunction, testicular pain, sexually transmitted disease, discharge and urethral stricture.  Musculoskeletal:  Negative for back pain, bone pain, arthralgias, tenderness, muscle weakness and neck pain.  Neurological:  Negative for dizziness, focal weakness, numbness, seizures and tremors.  Psychological:  Negative for depression and psychiatric problem.  Endocrine:  Negative for cold intolerance, thirst, excessive urination, fatigue and heat intolerance.  Hem/Lymphatic:  Negative for easy bleeding, easy bruising and frequent infections.      Urinalysis  UA - Dipstick  @LASTPROCAMB ([id number];[id number])@    UA - Micro  WBC - 0  RBC - 0  Bacteria - 0  Epith - 0    Physical Exam    There were no vitals taken for this visit.     GENERAL: No acute distress, Awake, Alert, Oriented X 3, Gait normal  CARDIAC: regular rate and rhythm  CHEST  AND LUNG: Easy work of breathing, clear to  auscultation bilaterally, no cyanosis  ABDOMEN: soft, non tender, non-distended, positive bowel sounds, no organomegaly, no palpable masses, no guarding, no rebound tenderness  SKIN: No rash, no erythema, no lacerations or abrasions, no ecchymosis  NEUROLOGIC: cranial nerves 2-12 grossly intact     Prostate is smooth, symmetric, and 40 g. No nodules present.    Results  Laboratory Studies  Urine shows trace blood, no nitrites, no leukocytes. PSA is 2.2.    Imaging  KUB last year showed a 2 mm stone on the right and three 2 mm stones in the left, all nonobstructing. KUB this year shows no visible stones in the right or left kidney or bladder.    Assessment and Plan    ICD-10-CM    1. Calculus of kidney  N20.0 AMB POC URINALYSIS DIP STICK AUTO W/O MICRO     AMB POC XRAY ABDOMEN 1 VIEW      2. Benign prostatic hyperplasia with urinary frequency  N40.1 PSA, Diagnostic    R35.0           Assessment & Plan  1. Annual follow-up.  His x-ray results are satisfactory, showing no visible stones in the right or left kidney or bladder. The urine test is also within normal limits, with no significant blood or signs of infection. The PSA level is commendable at 2.2. A digital rectal examination was performed today, revealing a smooth, symmetric prostate with no nodules and an estimated weight of 40 g.     He has been advised to maintain his current regimen of Flomax  0.4 mg daily. He has been instructed to initiate Flomax  therapy if he experiences pain suggestive of kidney stone passage.    Follow-up  The patient will follow up in 1 year.    Attestation      The patient (or guardian, if applicable) and other individuals in attendance with the patient were advised that Artificial Intelligence will be utilized during this visit to record, process the conversation to generate a clinical note, and support improvement of the AI technology. The patient (or guardian, if applicable) and other individuals in attendance at the appointment  consented to the use of AI, including the recording.        I have spent 20 minutes today reviewing previous notes, test results and face to face with the patient as well as documenting.      Enio Hornback A. Celestino Cole, M.D.    [facility]  [facility]  [address]  [place], [place] [address]  Phone: [phone]  Fax: [phone]
